# Patient Record
Sex: Female | Born: 1950
Health system: Southern US, Community
[De-identification: ages and names within clinical notes are randomized; demographics above are authoritative.]

## PROBLEM LIST (undated history)

## (undated) DIAGNOSIS — E669 Obesity, unspecified: Secondary | ICD-10-CM

## (undated) DIAGNOSIS — E7800 Pure hypercholesterolemia, unspecified: Secondary | ICD-10-CM

## (undated) DIAGNOSIS — R1900 Intra-abdominal and pelvic swelling, mass and lump, unspecified site: Secondary | ICD-10-CM

## (undated) HISTORY — DX: Intra-abdominal and pelvic swelling, mass and lump, unspecified site: R19.00

## (undated) HISTORY — DX: Obesity, unspecified: E66.9

## (undated) HISTORY — DX: Pure hypercholesterolemia, unspecified: E78.00

## (undated) HISTORY — PX: TUBAL LIGATION: SHX77

## (undated) HISTORY — PX: COLONOSCOPY: SHX174

---

## 2013-04-19 ENCOUNTER — Emergency Department (HOSPITAL_COMMUNITY)
Admission: EM | Admit: 2013-04-19 | Discharge: 2013-04-19 | Disposition: A | Discharge status: Home / Self Care | Payer: BC Managed Care – PPO | Source: Home / Self Care | Attending: Family Medicine | Admitting: Family Medicine

## 2013-04-19 ENCOUNTER — Encounter (HOSPITAL_COMMUNITY): Payer: Self-pay

## 2013-04-19 DIAGNOSIS — M778 Other enthesopathies, not elsewhere classified: Secondary | ICD-10-CM

## 2013-04-19 DIAGNOSIS — IMO0002 Reserved for concepts with insufficient information to code with codable children: Secondary | ICD-10-CM

## 2013-04-19 DIAGNOSIS — M6580 Other synovitis and tenosynovitis, unspecified site: Secondary | ICD-10-CM

## 2013-04-19 MED ORDER — KETOROLAC TROMETHAMINE 30 MG/ML IJ SOLN
30.0000 mg | Freq: Once | INTRAMUSCULAR | Status: AC
  Administered 2013-04-19: 30 mg via INTRAMUSCULAR

## 2013-04-19 MED ORDER — KETOROLAC TROMETHAMINE 30 MG/ML IJ SOLN
INTRAMUSCULAR | Status: AC
  Filled 2013-04-19: qty 1

## 2013-04-19 MED ORDER — DICLOFENAC POTASSIUM 50 MG PO TABS: 50.0000 mg | ORAL_TABLET | Freq: Three times a day (TID) | ORAL | Status: DC

## 2013-04-19 NOTE — ED Notes (Signed)
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 Both arms are painful to touch, R>L,  Right arm swollen, difficult to supinate, pronate her right arm , arrived w ACE wrap on her right arm

## 2013-04-19 NOTE — ED Provider Notes (Signed)
History     CSN: [id number]  Arrival date & time 04/19/13  1347   First MD Initiated Contact with Patient 04/19/13 1426      Chief Complaint  Patient presents with  . Arm Pain    (Consider location/radiation/quality/duration/timing/severity/associated sxs/prior treatment) Patient is a 62 y.o. female presenting with arm pain. The history is provided by the patient.  Arm Pain This is a new problem. 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 The problem has been gradually worsening (bilat sx but r>l).    History reviewed. No pertinent past medical history.  History reviewed. No pertinent past surgical history.  History reviewed. No pertinent family history.  History  Substance Use Topics  . Smoking status: Current Every Day Smoker  . Smokeless tobacco: Not on file  . Alcohol Use: Yes    OB History   Grav Para Term Preterm Abortions TAB SAB Ect Mult Living                  Review of Systems  Musculoskeletal: Positive for myalgias and joint swelling.  Skin: Negative.     Allergies  Review of patient's allergies indicates no known allergies.  Home Medications   Current Outpatient Rx  Name  Route  Sig  Dispense  Refill  . diclofenac (CATAFLAM) 50 MG tablet   Oral   Take 1 tablet (50 mg total) by mouth 3 (three) times daily. Prn arm pain   30 tablet   0     BP 153/84  Pulse 78  Temp(Src) 98.2 F (36.8 C) (Oral)  Resp 18  SpO2 96%  Physical Exam  Nursing note and vitals reviewed. Constitutional: She is oriented to person, place, and time. She appears well-developed and well-nourished.  Musculoskeletal: She exhibits tenderness.  bilat forearm soreness and sts r>l, nv fxn intact.. No warmth or erythema.  Neurological: She is alert and oriented to person, place, and time.  Skin: Skin is warm and dry.    ED Course  Procedures (including critical care time)  Labs Reviewed - No data to display No results  found.   1. Tendonitis of elbow or forearm       MDM          Linna Hoff, MD 04/19/13 1517

## 2014-03-21 ENCOUNTER — Other Ambulatory Visit (HOSPITAL_COMMUNITY)
Admission: RE | Admit: 2014-03-21 | Discharge: 2014-03-21 | Disposition: A | Discharge status: Home / Self Care | Payer: BC Managed Care – PPO | Source: Ambulatory Visit | Attending: Family Medicine | Admitting: Family Medicine

## 2014-03-21 ENCOUNTER — Other Ambulatory Visit: Payer: Self-pay | Admitting: Physician Assistant

## 2014-03-21 DIAGNOSIS — Z124 Encounter for screening for malignant neoplasm of cervix: Secondary | ICD-10-CM | POA: Insufficient documentation

## 2014-07-03 ENCOUNTER — Other Ambulatory Visit: Payer: Self-pay | Admitting: Gastroenterology

## 2016-03-25 ENCOUNTER — Other Ambulatory Visit: Payer: Self-pay | Admitting: Physician Assistant

## 2016-03-25 ENCOUNTER — Other Ambulatory Visit (HOSPITAL_COMMUNITY)
Admission: RE | Admit: 2016-03-25 | Discharge: 2016-03-25 | Disposition: A | Discharge status: Home / Self Care | Payer: BC Managed Care – PPO | Source: Ambulatory Visit | Attending: Family Medicine | Admitting: Family Medicine

## 2016-03-25 DIAGNOSIS — Z124 Encounter for screening for malignant neoplasm of cervix: Secondary | ICD-10-CM | POA: Insufficient documentation

## 2016-03-27 LAB — CYTOLOGY - PAP

## 2017-11-16 DIAGNOSIS — R0989 Other specified symptoms and signs involving the circulatory and respiratory systems: Secondary | ICD-10-CM | POA: Diagnosis not present

## 2017-11-16 DIAGNOSIS — Z23 Encounter for immunization: Secondary | ICD-10-CM | POA: Diagnosis not present

## 2017-11-16 DIAGNOSIS — Z720 Tobacco use: Secondary | ICD-10-CM | POA: Diagnosis not present

## 2017-11-16 DIAGNOSIS — H1130 Conjunctival hemorrhage, unspecified eye: Secondary | ICD-10-CM | POA: Diagnosis not present

## 2017-11-18 DIAGNOSIS — R1313 Dysphagia, pharyngeal phase: Secondary | ICD-10-CM | POA: Diagnosis not present

## 2020-01-24 ENCOUNTER — Ambulatory Visit
Admission: RE | Admit: 2020-01-24 | Discharge: 2020-01-24 | Disposition: A | Discharge status: Home / Self Care | Payer: BC Managed Care – PPO | Source: Ambulatory Visit | Attending: Physician Assistant | Admitting: Physician Assistant

## 2020-01-24 ENCOUNTER — Other Ambulatory Visit (HOSPITAL_COMMUNITY): Payer: Medicare Other

## 2020-01-24 ENCOUNTER — Emergency Department (HOSPITAL_COMMUNITY)
Admission: EM | Admit: 2020-01-24 | Discharge: 2020-01-25 | Disposition: A | Discharge status: Home / Self Care | Payer: Medicare Other | Attending: Emergency Medicine | Admitting: Emergency Medicine

## 2020-01-24 ENCOUNTER — Encounter (HOSPITAL_COMMUNITY): Payer: Self-pay

## 2020-01-24 ENCOUNTER — Other Ambulatory Visit: Payer: Self-pay

## 2020-01-24 ENCOUNTER — Other Ambulatory Visit: Payer: Self-pay | Admitting: Physician Assistant

## 2020-01-24 ENCOUNTER — Emergency Department (HOSPITAL_COMMUNITY): Payer: Medicare Other

## 2020-01-24 DIAGNOSIS — F17210 Nicotine dependence, cigarettes, uncomplicated: Secondary | ICD-10-CM | POA: Insufficient documentation

## 2020-01-24 DIAGNOSIS — R1900 Intra-abdominal and pelvic swelling, mass and lump, unspecified site: Secondary | ICD-10-CM | POA: Diagnosis not present

## 2020-01-24 DIAGNOSIS — R102 Pelvic and perineal pain: Secondary | ICD-10-CM

## 2020-01-24 DIAGNOSIS — Z20822 Contact with and (suspected) exposure to covid-19: Secondary | ICD-10-CM | POA: Insufficient documentation

## 2020-01-24 DIAGNOSIS — N838 Other noninflammatory disorders of ovary, fallopian tube and broad ligament: Secondary | ICD-10-CM | POA: Diagnosis not present

## 2020-01-24 DIAGNOSIS — R319 Hematuria, unspecified: Secondary | ICD-10-CM | POA: Insufficient documentation

## 2020-01-24 DIAGNOSIS — R109 Unspecified abdominal pain: Secondary | ICD-10-CM

## 2020-01-24 LAB — COMPREHENSIVE METABOLIC PANEL
ALT: 12 U/L (ref 0–44)
AST: 14 U/L — ABNORMAL LOW (ref 15–41)
Albumin: 2.9 g/dL — ABNORMAL LOW (ref 3.5–5.0)
Alkaline Phosphatase: 71 U/L (ref 38–126)
Anion gap: 7 (ref 5–15)
BUN: 10 mg/dL (ref 8–23)
CO2: 24 mmol/L (ref 22–32)
Calcium: 9.1 mg/dL (ref 8.9–10.3)
Chloride: 102 mmol/L (ref 98–111)
Creatinine, Ser: 0.96 mg/dL (ref 0.44–1.00)
GFR calc Af Amer: >60 mL/min (ref >60)
GFR calc non Af Amer: >60 mL/min (ref >60)
Glucose, Bld: 123 mg/dL — ABNORMAL HIGH (ref 70–99)
Potassium: 3.8 mmol/L (ref 3.5–5.1)
Sodium: 133 mmol/L — ABNORMAL LOW (ref 135–145)
Total Bilirubin: 0.2 mg/dL — ABNORMAL LOW (ref 0.3–1.2)
Total Protein: 7.5 g/dL (ref 6.5–8.1)

## 2020-01-24 LAB — URINALYSIS, ROUTINE W REFLEX MICROSCOPIC
Bilirubin Urine: NEGATIVE
Glucose, UA: NEGATIVE mg/dL
Hgb urine dipstick: NEGATIVE
Ketones, ur: NEGATIVE mg/dL
Nitrite: NEGATIVE
Protein, ur: NEGATIVE mg/dL
Specific Gravity, Urine: 1.034 — ABNORMAL HIGH (ref 1.005–1.030)
pH: 6 (ref 5.0–8.0)

## 2020-01-24 LAB — CBC WITH DIFFERENTIAL/PLATELET
Abs Immature Granulocytes: 0.18 10*3/uL — ABNORMAL HIGH (ref 0.00–0.07)
Basophils Absolute: 0.1 10*3/uL (ref 0.0–0.1)
Basophils Relative: 1 %
Eosinophils Absolute: 1.3 10*3/uL — ABNORMAL HIGH (ref 0.0–0.5)
Eosinophils Relative: 6 %
HCT: 31.3 % — ABNORMAL LOW (ref 36.0–46.0)
Hemoglobin: 10.1 g/dL — ABNORMAL LOW (ref 12.0–15.0)
Immature Granulocytes: 1 %
Lymphocytes Relative: 19 %
Lymphs Abs: 4.4 10*3/uL — ABNORMAL HIGH (ref 0.7–4.0)
MCH: 23.9 pg — ABNORMAL LOW (ref 26.0–34.0)
MCHC: 32.3 g/dL (ref 30.0–36.0)
MCV: 74 fL — ABNORMAL LOW (ref 80.0–100.0)
Monocytes Absolute: 1.7 10*3/uL — ABNORMAL HIGH (ref 0.1–1.0)
Monocytes Relative: 7 %
Neutro Abs: 15.4 10*3/uL — ABNORMAL HIGH (ref 1.7–7.7)
Neutrophils Relative %: 66 %
Platelets: 644 10*3/uL — ABNORMAL HIGH (ref 150–400)
RBC: 4.23 MIL/uL (ref 3.87–5.11)
RDW: 15.9 % — ABNORMAL HIGH (ref 11.5–15.5)
WBC: 23.1 10*3/uL — ABNORMAL HIGH (ref 4.0–10.5)
nRBC: 0.1 % (ref 0.0–0.2)

## 2020-01-24 LAB — LACTIC ACID, PLASMA: Lactic Acid, Venous: 1.4 mmol/L (ref 0.5–1.9)

## 2020-01-24 LAB — LIPASE, BLOOD: Lipase: 20 U/L (ref 11–51)

## 2020-01-24 MED ORDER — IOPAMIDOL (ISOVUE-300) INJECTION 61%
125.0000 mL | Freq: Once | INTRAVENOUS | Status: AC | PRN
  Administered 2020-01-24: 125 mL via INTRAVENOUS

## 2020-01-24 MED ORDER — MORPHINE SULFATE (PF) 4 MG/ML IV SOLN
4.0000 mg | Freq: Once | INTRAVENOUS | Status: AC
  Administered 2020-01-24: 4 mg via INTRAVENOUS
  Filled 2020-01-24: qty 1

## 2020-01-24 MED ORDER — ONDANSETRON HCL 4 MG/2ML IJ SOLN
4.0000 mg | Freq: Once | INTRAMUSCULAR | Status: AC
  Administered 2020-01-24: 4 mg via INTRAVENOUS
  Filled 2020-01-24: qty 2

## 2020-01-24 NOTE — ED Triage Notes (Signed)
Pt reports ongoing lower abd pain and back pain for about 3-4 days. Pt seen at [facility] clinic with WBC noted to be 21.9. pt states they are working her up to diagnose cancer. Pt having blood in her urine as well. Pt a.o, denies n.v at this time.

## 2020-01-25 ENCOUNTER — Emergency Department (HOSPITAL_COMMUNITY): Payer: Medicare Other

## 2020-01-25 ENCOUNTER — Telehealth: Payer: Self-pay | Admitting: *Deleted

## 2020-01-25 LAB — WET PREP, GENITAL
Clue Cells Wet Prep HPF POC: NONE SEEN
Sperm: NONE SEEN
Trich, Wet Prep: NONE SEEN
Yeast Wet Prep HPF POC: NONE SEEN

## 2020-01-25 LAB — SARS CORONAVIRUS 2 (TAT 6-24 HRS): SARS Coronavirus 2: NEGATIVE

## 2020-01-25 MED ORDER — DOXYCYCLINE HYCLATE 100 MG PO CAPS: 100.0000 mg | ORAL_CAPSULE | Freq: Two times a day (BID) | ORAL | 0 refills | Status: DC

## 2020-01-25 MED ORDER — CEFTRIAXONE SODIUM 500 MG IJ SOLR
500.0000 mg | Freq: Once | INTRAMUSCULAR | Status: AC
  Administered 2020-01-25: 500 mg via INTRAMUSCULAR
  Filled 2020-01-25: qty 500

## 2020-01-25 MED ORDER — OXYCODONE-ACETAMINOPHEN 5-325 MG PO TABS: 1.0000 | ORAL_TABLET | Freq: Four times a day (QID) | ORAL | 0 refills | Status: DC | PRN

## 2020-01-25 MED ORDER — DOXYCYCLINE HYCLATE 100 MG PO TABS
100.0000 mg | ORAL_TABLET | Freq: Once | ORAL | Status: AC
  Administered 2020-01-25: 02:00:00 100 mg via ORAL
  Filled 2020-01-25: qty 1

## 2020-01-25 MED ORDER — METRONIDAZOLE 500 MG PO TABS
500.0000 mg | ORAL_TABLET | Freq: Once | ORAL | Status: AC
  Administered 2020-01-25: 500 mg via ORAL
  Filled 2020-01-25: qty 1

## 2020-01-25 MED ORDER — STERILE WATER FOR INJECTION IJ SOLN
INTRAMUSCULAR | Status: AC
  Filled 2020-01-25: qty 10

## 2020-01-25 MED ORDER — METRONIDAZOLE 500 MG PO TABS: 500.0000 mg | ORAL_TABLET | Freq: Two times a day (BID) | ORAL | 0 refills | Status: DC

## 2020-01-25 NOTE — Discharge Instructions (Signed)
You were seen today and have a mass in your pelvis.  It is not clear exactly what it is but it may be cancer versus an infection.  Take antibiotics as directed.  Take pain medicine as needed.  Do not drive while taking pain medication.  It is very important follow-up with OB/GYN for definitive evaluation.

## 2020-01-25 NOTE — Telephone Encounter (Addendum)
-----   Message from Osborne Oman, MD sent at 01/25/2020  2:01 AM EDT ----- Regarding: Needs appointment very soon Please schedule a visit with any GYN surgeon in the next 1-2 weeks. Seen in ED today 01/25/20 for large pelvic mass concerning for possible cancer.   Unsure of where the pelvic mass is coming from as ultrasound is not clear, so ordered for outpatient MRI of pelvis.  Please schedule this prior to the patient's appointment so that results can be discussed during visit.  Thank you!  3/25  1455  Pt was called and notified of plan of care as follows:     4/3  1:00 pm  MRI @ [place] @ 12:30   4/7  3:35 pm  Office appt w/Dr. Rip Harbour  Pt voiced understanding of all information and instructions given.

## 2020-01-25 NOTE — ED Provider Notes (Signed)
[facility] EMERGENCY DEPARTMENT Provider Note   CSN: [id number] Arrival date & time: 01/24/20  1636     History Chief Complaint  Patient presents with  . Abdominal Pain    Catherine Lynn is a 69 y.o. female.  HPI     This is a 69 year old female with past medical history of hypertension who presents with abdominal pain.  Patient reports onset of abdominal pain on Saturday.  She also reports episodes of hematuria since that time.  She states that the pain is sharp and in her lower abdomen.  It is nonradiating.  It is currently 8 out of 10.  Nothing seems to make it better or worse.  She states that preceding that abdominal pain she had multiple episodes of nonbloody emesis last week.  She was seen and evaluated yesterday by her primary physician and had lab work and CT imaging done.  She was called by them and instructed to come to the ER as she had leukocytosis and concerning findings on her CT of a mass that might be cancer.  She denies any fevers, cough, infectious symptoms.  She does report some back pain.  Patient states that she is not sexually active.  I have reviewed her CT findings.  She has a roughly 7 x 7 x 9 cm mass adjacent to the bladder with some stranding.  Concerning for possible GYN neoplasm versus TOA.  History reviewed. No pertinent past medical history.  There are no problems to display for this patient.   History reviewed. No pertinent surgical history.   OB History   No obstetric history on file.     No family history on file.  Social History   Tobacco Use  . Smoking status: Current Every Day Smoker  Substance Use Topics  . Alcohol use: Yes  . Drug use: Not on file    Home Medications Prior to Admission medications   Medication Sig Start Date End Date Taking? Authorizing Provider  diclofenac (CATAFLAM) 50 MG tablet Take 1 tablet (50 mg total) by mouth 3 (three) times daily. Prn arm pain 04/19/13   Billy Fischer, MD    doxycycline (VIBRAMYCIN) 100 MG capsule Take 1 capsule (100 mg total) by mouth 2 (two) times daily. 01/25/20   Julaine Zimny, Barbette Hair, MD  metroNIDAZOLE (FLAGYL) 500 MG tablet Take 1 tablet (500 mg total) by mouth 2 (two) times daily. 01/25/20   Caley Volkert, Barbette Hair, MD  oxyCODONE-acetaminophen (PERCOCET/ROXICET) 5-325 MG tablet Take 1 tablet by mouth every 6 (six) hours as needed for severe pain. 01/25/20   Eura Radabaugh, Barbette Hair, MD    Allergies    Patient has no known allergies.  Review of Systems   Review of Systems  Constitutional: Negative for fever.  Respiratory: Negative for shortness of breath.   Cardiovascular: Negative for chest pain.  Gastrointestinal: Positive for abdominal pain, nausea and vomiting.  Genitourinary: Positive for hematuria.  Musculoskeletal: Positive for back pain.  All other systems reviewed and are negative.   Physical Exam Updated Vital Signs BP (!) 140/47   Pulse 85   Temp 99.4 F (37.4 C) (Oral)   Resp 16   Ht 1.6 m (5\' 3" )   Wt 94.3 kg   SpO2 97%   BMI 36.81 kg/m   Physical Exam Vitals and nursing note reviewed.  Constitutional:      Appearance: She is well-developed. She is obese. She is not ill-appearing.  HENT:     Head: Normocephalic and atraumatic.  Eyes:     Pupils: Pupils are equal, round, and reactive to light.  Cardiovascular:     Rate and Rhythm: Normal rate and regular rhythm.     Heart sounds: Normal heart sounds.  Pulmonary:     Effort: Pulmonary effort is normal. No respiratory distress.     Breath sounds: No wheezing.  Abdominal:     General: Bowel sounds are normal.     Palpations: Abdomen is soft.     Tenderness: There is abdominal tenderness in the suprapubic area. There is no guarding or rebound.  Genitourinary:    Comments: Slight discharge noted in the vaginal vault, no gross pus, no cervical motion tenderness, mass palpated on bimanual exam without significant tenderness Musculoskeletal:     Cervical back: Neck supple.   Skin:    General: Skin is warm and dry.  Neurological:     Mental Status: She is alert and oriented to person, place, and time.  Psychiatric:        Mood and Affect: Mood normal.     ED Results / Procedures / Treatments   Labs (all labs ordered are listed, but only abnormal results are displayed) Labs Reviewed  WET PREP, GENITAL - Abnormal; Notable for the following components:      Result Value   WBC, Wet Prep HPF POC MANY (*)    All other components within normal limits  COMPREHENSIVE METABOLIC PANEL - Abnormal; Notable for the following components:   Sodium 133 (*)    Glucose, Bld 123 (*)    Albumin 2.9 (*)    AST 14 (*)    Total Bilirubin 0.2 (*)    All other components within normal limits  CBC WITH DIFFERENTIAL/PLATELET - Abnormal; Notable for the following components:   WBC 23.1 (*)    Hemoglobin 10.1 (*)    HCT 31.3 (*)    MCV 74.0 (*)    MCH 23.9 (*)    RDW 15.9 (*)    Platelets 644 (*)    Neutro Abs 15.4 (*)    Lymphs Abs 4.4 (*)    Monocytes Absolute 1.7 (*)    Eosinophils Absolute 1.3 (*)    Abs Immature Granulocytes 0.18 (*)    All other components within normal limits  URINALYSIS, ROUTINE W REFLEX MICROSCOPIC - Abnormal; Notable for the following components:   Specific Gravity, Urine 1.034 (*)    Leukocytes,Ua TRACE (*)    Bacteria, UA RARE (*)    All other components within normal limits  SARS CORONAVIRUS 2 (TAT 6-24 HRS)  LACTIC ACID, PLASMA  LIPASE, BLOOD  LACTIC ACID, PLASMA  CA 125  GC/CHLAMYDIA PROBE AMP ([place]) NOT AT [facility]  WET PREP  (BD AFFIRM) ()    EKG None  Radiology CT ABDOMEN PELVIS W CONTRAST  Addendum Date: 01/24/2020   ADDENDUM REPORT: 01/24/2020 11:11 ADDENDUM: The cystic, irregularly septated mass like area in the right hemipelvis displaces the uterus from left to right and is closely associated with the colon that shows diverticular changes. It does however appear to be centered within the right ovary based  on the position of the ovarian vein though the ovary itself cannot be visualized. Correlate with any recent treatment with antibiotics, even if in the past couple of months as this could lead to an atypical appearance of an inflammatory process. Again gyn consultation and ultrasound is suggested as neoplasm remains a strong differential consideration. These results were called by telephone at the time of interpretation on  01/24/2020 at 11:11 am to provider NOELLE REDMON , who verbally acknowledged these results. Electronically Signed   By: Zetta Bills M.D.   On: 01/24/2020 11:11   Result Date: 01/24/2020 CLINICAL DATA:  Abdominal pain, nausea for 1 week no reported history of cancer. EXAM: CT ABDOMEN AND PELVIS WITH CONTRAST TECHNIQUE: Multidetector CT imaging of the abdomen and pelvis was performed using the standard protocol following bolus administration of intravenous contrast. CONTRAST:  14mL ISOVUE-300 IOPAMIDOL (ISOVUE-300) INJECTION 61% COMPARISON:  None. FINDINGS: Lower chest: No acute findings at the lung bases. Hepatobiliary: Granulomata throughout the liver. No suspicious hepatic lesion with tiny area of low-density measuring approximately 3 mm in the left hepatic lobe (image 3, series 2) portal vein is patent into the liver. The gallbladder is normal without pericholecystic stranding. Mild intra and extrahepatic biliary ductal dilation up to 8 mm with focal area of low attenuation at the level of the ampulla perhaps measuring as much as 15 mm by 9 mm. Pancreas: No pancreatic ductal dilation or peripancreatic inflammation. Spleen: Granulomas throughout the spleen. Adrenals/Urinary Tract: Adrenal glands are normal. Symmetric renal enhancement with mild cortical scarring bilaterally. No hydronephrosis. Urinary bladder is closely related to a right parametrial and adnexal multi septate masslike area. This is atop the right anterior aspect of the urinary bladder. Stomach/Bowel: Rectosigmoid colon in  close proximity to the right adnexal and parametrial process as are small bowel loops in the lower abdomen. The appendix is visualized and is normal. Vascular/Lymphatic: Calcified and noncalcified plaque throughout the abdominal aorta without aneurysm. No adenopathy in the abdomen. Small lymph nodes about the pelvis none with evidence of pathologic enlargement by CT. Reproductive: Masslike area in the right parametrium with central low attenuation and incomplete peripheral septations the upper portion with more complex appearing process in the inferior aspect atop the urinary bladder. This measures 7.4 x 6.9 x 9.0 cm. There is minimal stranding present and a small amount pelvic ascites. Uterus is displaced from right to left. Ovarian vessels can be traced to the margin of this area left adnexa is normal. Other: No signs of hernia. Trace free fluid in the pelvis. Musculoskeletal: No acute bone finding or destructive bone process. IMPRESSION: 1. Masslike area in the right parametrial and adnexal region with central low attenuation and incomplete peripheral septations. This is atop the right anterior aspect of the urinary bladder. This area measures 7.4 x 6.9 x 9.0 cm. There is minimal stranding and a small amount of pelvic ascites. Findings may represent TOA with pyosalpinx though given minimal stranding and complex appearance ovarian neoplasm is also considered. Gyn consultation is suggested. Pelvic ultrasound may be helpful for further characterization. 2. Mild intra and extrahepatic biliary ductal dilation up to 8 mm with focal area of low attenuation at the level of the ampulla perhaps measuring as much as 15 mm by 9 mm. MRCP may be helpful for further, initial characterization. 3. Evidence of prior granulomatous disease in the liver and spleen. Aortic Atherosclerosis (ICD10-I70.0). Electronically Signed: By: Zetta Bills M.D. On: 01/24/2020 10:49   DG Chest Portable 1 View  Result Date: 01/24/2020 CLINICAL  DATA:  Lower abdominal pain, back pain. EXAM: PORTABLE CHEST 1 VIEW COMPARISON:  None. FINDINGS: Heart and mediastinal contours are within normal limits. No focal opacities or effusions. No acute bony abnormality. IMPRESSION: No active disease. Electronically Signed   By: Rolm Baptise M.D.   On: 01/24/2020 23:50   US PELVIC COMPLETE WITH TRANSVAGINAL  Result Date: 01/25/2020 CLINICAL DATA:  Pelvic mass seen on CT 01/24/2020 EXAM: TRANSABDOMINAL AND TRANSVAGINAL ULTRASOUND OF PELVIS TECHNIQUE: Both transabdominal and transvaginal ultrasound examinations of the pelvis were performed. Transabdominal technique was performed for global imaging of the pelvis including uterus, ovaries, adnexal regions, and pelvic cul-de-sac. It was necessary to proceed with endovaginal exam following the transabdominal exam to visualize the . COMPARISON:  CT 01/24/2020 FINDINGS: Uterus Measurements: Either adjacent to or extending from the right side of the uterus there is a complex heterogeneously cystic mass with thick internal septations and nodularity which measures 8.9 x 8.9 x 9.5 cm. No internal vascularity seen within the mass. The uterus measures 6.8 x 2.9 x 2.9 cm. Endometrium Thickness: 3 mm.  No focal abnormality visualized. Right ovary Measurements: Not visualized and cystic complex mass seen extending within the right adnexa. Left ovary Measurements: Not visualized Other findings Trace free fluid within the cul-de-sac. IMPRESSION: Complex cystic mass either extending from the superior right uterus or adjacent to the right uterus within the right adnexa measuring 8.9 x 8.9 x 9.5 cm. This could represent pyosalpinx, TOA, or uterine neoplasm. Nonvisualized ovaries. Electronically Signed   By: Prudencio Pair M.D.   On: 01/25/2020 00:56    Procedures Procedures (including critical care time)  Medications Ordered in ED Medications  sterile water (preservative free) injection (has no administration in time range)  morphine  4 MG/ML injection 4 mg (4 mg Intravenous Given 01/24/20 2352)  ondansetron (ZOFRAN) injection 4 mg (4 mg Intravenous Given 01/24/20 2352)  doxycycline (VIBRA-TABS) tablet 100 mg (100 mg Oral Given 01/25/20 0209)  metroNIDAZOLE (FLAGYL) tablet 500 mg (500 mg Oral Given 01/25/20 0209)  cefTRIAXone (ROCEPHIN) injection 500 mg (500 mg Intramuscular Given 01/25/20 0209)    ED Course  I have reviewed the triage vital signs and the nursing notes.  Pertinent labs & imaging results that were available during my care of the patient were reviewed by me and considered in my medical decision making (see chart for details).  Clinical Course as of Jan 25 215  Thu Jan 25, 2020  0155 Spoke with Dr. Verita Schneiders.  She reviewed the ultrasound.  This is not clearly neoplastic or infectious.  Given white count, would consider treatment.  She does not feel she needs IV treatment if she is not febrile or septic appearing.  Recommends discharge with doxycycline and Flagyl.  Will follow up in clinic.   [CH]  0201 Have added Rocephin IM and a CA-125 per gynecology recommendations.  Patient was updated on plan of care.   [CH]    Clinical Course User Index [CH] Clatie Kessen, Barbette Hair, MD   MDM Rules/Calculators/A&P                       Patient presents with pelvic pain.  Abnormal CT scan and leukocytosis from her primary office.  She is overall nontoxic-appearing.  Vital signs are reassuring.  She is afebrile here.  She does not appear septic and has a normal lactate.  Patient was given pain and nausea medication.  Pelvic ultrasound was obtained and shows a 9 x 9 x 9 cm TOA versus pyosalpinx versus neoplasm.  Pelvic exam is not concerning for gross pus and patient denies sexual activity.  However, given her white count, feel it is reasonable to treat for infection.  See discussion with OB/GYN above.  Plan for outpatient antibiotics and close follow-up with pelvic MRI.  Patient was advised of plan.  She will also be sent  with a short course of pain medication.  After history, exam, and medical workup I feel the patient has been appropriately medically screened and is safe for discharge home. Pertinent diagnoses were discussed with the patient. Patient was given return precautions.   Final Clinical Impression(s) / ED Diagnoses Final diagnoses:  Pelvic mass  Ovarian mass    Rx / DC Orders ED Discharge Orders         Ordered    MR PELVIS W WO CONTRAST     01/25/20 0206    doxycycline (VIBRAMYCIN) 100 MG capsule  2 times daily     01/25/20 0216    metroNIDAZOLE (FLAGYL) 500 MG tablet  2 times daily     01/25/20 0216    oxyCODONE-acetaminophen (PERCOCET/ROXICET) 5-325 MG tablet  Every 6 hours PRN     01/25/20 0216           Merryl Hacker, MD 01/25/20 [phone]

## 2020-01-26 LAB — GC/CHLAMYDIA PROBE AMP (~~LOC~~) NOT AT ARMC
Chlamydia: NEGATIVE
Neisseria Gonorrhea: NEGATIVE

## 2020-01-26 LAB — CA 125: Cancer Antigen (CA) 125: 13.1 U/mL (ref 0.0–38.1)

## 2020-02-03 ENCOUNTER — Ambulatory Visit (HOSPITAL_COMMUNITY): Admission: RE | Admit: 2020-02-03 | Payer: Medicare Other | Source: Ambulatory Visit

## 2020-02-07 ENCOUNTER — Encounter: Payer: Medicare Other | Admitting: Obstetrics & Gynecology

## 2020-02-12 ENCOUNTER — Ambulatory Visit (HOSPITAL_COMMUNITY)
Admission: RE | Admit: 2020-02-12 | Discharge: 2020-02-12 | Disposition: A | Discharge status: Home / Self Care | Payer: Medicare Other | Source: Ambulatory Visit | Attending: Obstetrics & Gynecology | Admitting: Obstetrics & Gynecology

## 2020-02-12 ENCOUNTER — Other Ambulatory Visit: Payer: Self-pay

## 2020-02-12 DIAGNOSIS — R102 Pelvic and perineal pain: Secondary | ICD-10-CM | POA: Insufficient documentation

## 2020-02-12 MED ORDER — GADOBUTROL 1 MMOL/ML IV SOLN
9.0000 mL | Freq: Once | INTRAVENOUS | Status: AC | PRN
  Administered 2020-02-12: 9 mL via INTRAVENOUS

## 2020-02-15 ENCOUNTER — Ambulatory Visit (INDEPENDENT_AMBULATORY_CARE_PROVIDER_SITE_OTHER): Payer: Medicare Other | Admitting: Obstetrics & Gynecology

## 2020-02-15 ENCOUNTER — Other Ambulatory Visit: Payer: Self-pay

## 2020-02-15 ENCOUNTER — Encounter: Payer: Self-pay | Admitting: Obstetrics & Gynecology

## 2020-02-15 VITALS — BP 139/79 | HR 90 | Ht 63.0 in | Wt 206.3 lb

## 2020-02-15 DIAGNOSIS — N907 Vulvar cyst: Secondary | ICD-10-CM | POA: Diagnosis not present

## 2020-02-15 DIAGNOSIS — R1900 Intra-abdominal and pelvic swelling, mass and lump, unspecified site: Secondary | ICD-10-CM

## 2020-02-15 NOTE — Progress Notes (Signed)
Patient ID: Catherine Lynn, female   DOB: 09-15-1951, 69 y.o.   MRN: [id number]  Chief Complaint  Patient presents with  . Gynecologic Exam  pelvic mass seen on imaging  HPI Catherine Lynn is a 69 y.o. female. G2P2 No LMP recorded. She was having some low abdominal pain and was seen by her PCP and was sent to the [facility] 3/24 for evaluation. CT and US showed a 7 x 9 cm right adnexal mass suspicious for neoplasm. Because the mass could have been infectious she was given antibiotic and this is complete. CA 125 was normal HPI  History reviewed. No pertinent past medical history.  Past Surgical History:  Procedure Laterality Date  . NO PAST SURGERIES      History reviewed. No pertinent family history.  Social History Social History   Tobacco Use  . Smoking status: Current Every Day Smoker  Substance Use Topics  . Alcohol use: Yes  . Drug use: Not on file    No Known Allergies  Current Outpatient Medications  Medication Sig Dispense Refill  . atorvastatin (LIPITOR) 20 MG tablet Take 20 mg by mouth daily.    Marland Kitchen oxyCODONE-acetaminophen (PERCOCET/ROXICET) 5-325 MG tablet Take 1 tablet by mouth every 6 (six) hours as needed for severe pain. 15 tablet 0  . diclofenac (CATAFLAM) 50 MG tablet Take 1 tablet (50 mg total) by mouth 3 (three) times daily. Prn arm pain (Patient not taking: Reported on 02/15/2020) 30 tablet 0   No current facility-administered medications for this visit.    Review of Systems Review of Systems  Constitutional: Negative.   Gastrointestinal: Positive for abdominal pain. Negative for abdominal distention.  Genitourinary: Positive for hematuria (resolved) and pelvic pain. Negative for vaginal bleeding and vaginal discharge.    Blood pressure 139/79, pulse 90, height 5\' 3"  (1.6 m), weight 206 lb 4.8 oz (93.6 kg).  Physical Exam Physical Exam Constitutional:      Appearance: Normal appearance. She is not ill-appearing.  Pulmonary:     Effort: Pulmonary effort  is normal.  Abdominal:     General: There is no distension.     Palpations: There is no mass.  Genitourinary:    Comments: Vulvar inclusion cysts on the right, not tender, cervix no lesions, uterus normal, fullness on the right possible mass, no tenderness Neurological:     Mental Status: She is alert.     Data Reviewed CLINICAL DATA:  Pelvic pain, mass  EXAM: MRI PELVIS WITHOUT AND WITH CONTRAST  TECHNIQUE: Multiplanar multisequence MR imaging of the pelvis was performed both before and after administration of intravenous contrast.  CONTRAST:  65mL GADAVIST GADOBUTROL 1 MMOL/ML IV SOLN  COMPARISON:  CT abdomen pelvis, 01/24/2020, pelvic ultrasound, 01/25/2020  FINDINGS: Urinary Tract:  No abnormality visualized.  Bowel:  Severe sigmoid diverticulosis.  Vascular/Lymphatic: No pathologically enlarged lymph nodes. No significant vascular abnormality seen.  Reproductive: No significant change in a multi loculated appearing, thick rinded, contrast enhancing complex mass or fluid collection which appears to arise from the right ovary or adnexa, measuring 8.2 x 7.1 x 6.3 cm. This is again noted to lie in very close apposition to the severely diverticular sigmoid colon, with loss of distinguishing fat plane. The normal right ovary cannot be visualized. The uterus is shifted leftward, and as with the sigmoid colon, cannot be clearly distinguished from the adjacent mass.  Other:  None.  Musculoskeletal: No suspicious bone lesions identified.  IMPRESSION: 1. No significant change in a multi loculated appearing, thick rinded,  contrast enhancing complex mass or fluid collection which appears to arise from the right ovary or adnexa, measuring 8.2 x 7.1 x 6.3 cm. This remains most concerning for primary ovarian malignancy although infectious process such as tubo-ovarian abscess or an unusually large, chronic diverticular abscess are difficult to exclude based on  appearance and relationship to adjacent structures.  2. No secondary findings of malignancy such as lymphadenopathy in the pelvis.   Electronically Signed   By: Eddie Candle M.D.   On: 02/12/2020 18:13 Results for [name], [name] (MRN [id number]) as of 02/15/2020 11:04  Ref. Range 01/25/2020 02:01  Cancer Antigen (CA) 125 Latest Ref Range: 0.0 - 38.1 U/mL 13.1   Assessment Right adnexal mass with findings most concerning for primary ovarian malignancy although infectious process such as tubo-ovarian abscess   Plan She will be referred to [facility] oncology for consultation and management PCP [facility]    Emeterio Reeve 02/15/2020, 11:01 AM

## 2020-02-15 NOTE — Patient Instructions (Signed)
Pelvic Mass, Female  A pelvic mass is an abnormal growth in the pelvis. The pelvis is the area between your hip bones. It includes the bladder, rectum, uterus, and ovaries. A pelvic mass may be found during a routine pelvic exam or while performing an MRI, CT scan, or ultrasound for other problems of the abdomen. What are common types of pelvic masses? Pelvic masses include:  Ovarian cysts. These are fluid-filled sacs that form on an ovary.  Tumors. These may be cancerous (malignant) or noncancerous (benign). Noncancerous tumors in the uterus are called uterine fibroids.  Ectopic pregnancy. This is when the fertilized egg attaches (implants) outside the uterus.  Infections. What type of testing may be needed? Your health care provider may recommend that you have tests to diagnose the cause of the pelvic mass. The following tests may be done if a pelvic mass is found:  Physical exam.  Blood tests.  X-rays.  Ultrasound.  CT scan.  MRI.  A surgery to look inside your abdomen with cameras (laparoscopy).  A biopsy that is performed with a needle or during laparoscopy or surgery. In some cases, what seemed like a pelvic mass may actually be something else, such as a mass in one of the organs that is near the pelvis, an infection (abscess), or scar tissue (adhesions) that formed after a surgery. Tests and physical exams may be done once, or they may be done regularly for a period of time. Tests and exams that are done regularly will help monitor whether the mass or tissue change is growing and becoming a concern. What are common treatments? Treatment is not always needed for this condition. Your health care provider may recommend careful monitoring (watchful waiting) and regular tests and exams. Treatment will depend on the cause of the mass. Follow these instructions at home:  What you need to do at home will depend on the cause of the mass. Follow the instructions that your health  care provider gives to you. In general: ? Keep all follow-up visits as directed by your health care provider. This is important. ? Take over-the-counter and prescription medicines only as directed by your health care provider. ? If you were prescribed an antibiotic medicine, take it as told by your health care provider. Do not stop taking the antibiotic even if you start to feel better. ? Follow any restrictions that are given to you by your health care provider.  Try to stay calm, and be sure to ask questions. Make sure you understand the recommendations for monitoring and whether there is a reason for concern. Contact a health care provider if you:  Develop new symptoms.  Note changes in the size, shape, or position of your mass.  Are unable to have a bowel movement.  Bruise or bleed easily. Get help right away if you:  Vomit bright red blood or vomit material that looks like coffee grounds.  Have blood in your stools, or the stools turn black and tarry.  Have an abnormal or increased amount of vaginal bleeding.  Have a fever or chills.  Develop sudden or worsening pain that is not relieved by medicine.  Feel dizzy or weak.  Feel light-headed or you faint.  Feel that the mass has suddenly gotten larger.  Develop severe bloating in your abdomen or your pelvis.  Cannot pass any urine. Summary  A pelvic mass is an abnormal growth in the pelvis. The pelvis is the area between your hip bones. It includes the bladder, rectum,  uterus, and ovaries.  Pelvic masses include ovarian cysts, tumors, ectopic pregnancy, or infections.  Your health care provider may recommend that you have tests to diagnose the cause of the pelvic mass.  Treatment will depend on the cause of the mass. This information is not intended to replace advice given to you by your health care provider. Make sure you discuss any questions you have with your health care provider. Document Revised: 11/10/2017  Document Reviewed: 11/10/2017 Elsevier Patient Education  2020 [employer]  

## 2020-02-19 NOTE — Addendum Note (Signed)
Addended by: Bethanne Ginger on: 02/19/2020 04:29 PM   Modules accepted: Orders

## 2020-02-21 ENCOUNTER — Telehealth: Payer: Self-pay | Admitting: *Deleted

## 2020-02-21 NOTE — Telephone Encounter (Signed)
Called and spoke with the patient regarding her new appt patient. Scheduled an appt for 4/27 at 10:30 am. Gave the address and phone for the clinic. Gave the policy for parking, visitors and mask

## 2020-02-26 NOTE — H&P (View-Only) (Signed)
GYNECOLOGIC ONCOLOGY NEW PATIENT CONSULTATION   Patient Name: Catherine Lynn  Patient Age: 69 y.o. Date of Service: 02/27/20 Referring Provider: Lennie Odor, [place] [employer] [address] [place],  [place] [address]   Primary Care Provider: Lennie Odor, [place] Consulting Provider: Jeral Pinch, MD   Assessment/Plan:  Adnexal mass in a postmenopausal female which may be ovarian in origin versus related to significant diverticular disease.  Discussed imaging findings with the patient which show an adnexal mass slightly less than 10 cm with some features concerning for possible malignancy.  There is no evidence of metastatic disease on multiple imaging modalities.  I am somewhat concerned regarding possibility of bowel involvement given loss of fat plane between this mass and the sigmoid on her recent MRI.  Although her normal Ca1 25 is overall reassuring, we discussed the nonspecific and nonsensitive nature of Ca1 25.  Additionally, this tumor marker can be normal in 30-50% of early-stage ovarian cancers.  I recommended proceeding with surgical exploration for both diagnostic and therapeutic purposes.  The patient wished that her sister be on the phone to hear this discussion and I reviewed this information again with her sister listening.    The patient also wanted to know if it was possible to biopsy or drain the mass.  We discussed the possibility of peritoneal seeding and spread if this is in fact a cancer.  Given the fact that she is a good surgical candidate, I would recommend proceeding with surgery.  Ultimately, in consultation with her sister, she was in agreement.  I discussed the surgical plan of performing a robotic BSO.  I would plan to remove the adnexal mass first and send this to pathology for intraoperative frozen section.  If no malignancy identified, then no further surgery would be performed.  If malignancy is identified, then I would recommend staging to include total  hysterectomy, omentectomy, lymph node dissection, peritoneal biopsies, and tumor debulking. Possible bowel surgery.  Given risk of bowel involvement requiring possible bowel resection, I will have the patient perform a bowel prep prior to surgery.  This information was reviewed with her today.  The risks of surgery were discussed in detail and she understands these to include infection; wound separation; hernia; vaginal cuff separation, injury to adjacent organs such as bowel, bladder, blood vessels, ureters and nerves; bleeding which may require blood transfusion; anesthesia risk; thromboembolic events; possible death; unforeseen complications; possible need for re-exploration; medical complications such as heart attack, stroke, pleural effusion and pneumonia; and, if full lymphadenectomy is performed the risk of lymphedema and lymphocyst. The patient will receive DVT and antibiotic prophylaxis as indicated. She voiced a clear understanding. She had the opportunity to ask questions. Perioperative instructions were reviewed with her. Prescriptions for post-op medications were sent to her pharmacy of choice.  A copy of this note was sent to the patient's referring provider.   50 minutes of total time was spent for this patient encounter, including preparation, face-to-face counseling with the patient and coordination of care, and documentation of the encounter.  Jeral Pinch, MD  Division of Gynecologic Oncology  Department of Obstetrics and Gynecology  [facility]  ___________________________________________  Chief Complaint: Chief Complaint  Patient presents with  . Adnexal mass    New pateint    History of Present Illness:  Catherine Lynn is a 69 y.o. y.o. female who is seen in consultation at the request of [place], [place], [place] for an evaluation of an adnexal mass.  She  was having some low abdominal pain and was seen by her PCP in mid-March. She had lab work  and CT imaging performed and was instructed to present to the ER given findings of a mass on CT and elevated WBC. She was see in the [facility] ED on 3/24. CT and US showed a 7 x 9 cm right adnexal mass suspicious for neoplasm. Because the mass could have been infectious she was treated with antibiotics. CA 125 was normal.  She subsequently underwent an MRI of the pelvis on 4/12 showing a right adnexal mass measuring 8.2 x 7.1 x 6.3 cm in close proximity to the severely diverticular sigmoid colon with loss of distinguishing fat plane.  The patient notes continued intermittent back and bilateral side pain for which she takes Tylenol with some relief.  She notes decreased appetite over the last couple of weeks as well as about a 20 pound weight loss.  She also endorses constipation and early satiety during this time.  After starting her antibiotics, she noticed some minimal vaginal spotting when she wiped.  She denies any other vaginal bleeding or cramping.  She notes some urinary frequency the last several weeks.  Endorses occasional alcohol intake, remote tobacco use history, and THC use currently approximately once a month.  PAST MEDICAL HISTORY:  Past Medical History:  Diagnosis Date  . High cholesterol   . Obesity (BMI 30-39.9)   . Pelvic mass      PAST SURGICAL HISTORY:  Past Surgical History:  Procedure Laterality Date  . TUBAL LIGATION      OB/GYN HISTORY:  OB History  Gravida Para Term Preterm AB Living  2 2       2   SAB TAB Ectopic Multiple Live Births          2    # Outcome Date GA Lbr Len/2nd Weight Sex Delivery Anes PTL Lv  2 Para           1 Para             No LMP recorded.  Age at menarche: 36-17  Age at menopause: early 36s Hx of HRT: no Hx of STDs: no Last pap: 2017 History of abnormal pap smears: no  MEDICATIONS: Outpatient Encounter Medications as of 02/27/2020  Medication Sig  . atorvastatin (LIPITOR) 20 MG tablet Take 20 mg by mouth daily.  Derrill Memo ON  03/11/2020] erythromycin base (E-MYCIN) 500 MG tablet The day before surgery, take 2 tablets at 2pm, 3pm, and 10 pm  . ibandronate (BONIVA) 150 MG tablet Take 150 mg by mouth every 30 (thirty) days.  Marland Kitchen ibuprofen (ADVIL) 600 MG tablet Take 1 tablet (600 mg total) by mouth every 6 (six) hours as needed for moderate pain. For AFTER surgery  . [START ON 03/11/2020] neomycin (MYCIFRADIN) 500 MG tablet The day before surgery, take 2 tablets at 2pm, 3pm, and 10 pm  . senna-docusate (SENOKOT-S) 8.6-50 MG tablet Take 2 tablets by mouth at bedtime. For AFTER surgery, do not take if having diarrhea  . traMADol (ULTRAM) 50 MG tablet Take 1 tablet (50 mg total) by mouth every 6 (six) hours as needed for severe pain. For AFTER surgery, do not take and drive  . [DISCONTINUED] diclofenac (CATAFLAM) 50 MG tablet Take 1 tablet (50 mg total) by mouth 3 (three) times daily. Prn arm pain (Patient not taking: Reported on 02/15/2020)  . [DISCONTINUED] oxyCODONE-acetaminophen (PERCOCET/ROXICET) 5-325 MG tablet Take 1 tablet by mouth every 6 (six) hours as  needed for severe pain. (Patient not taking: Reported on 02/27/2020)   No facility-administered encounter medications on file as of 02/27/2020.    ALLERGIES:  No Known Allergies   FAMILY HISTORY:  Family History  Problem Relation Age of Onset  . Breast cancer Sister   . Diabetes Sister   . Cancer Maternal Grandfather        not sure  . Cancer Niece        not sure     SOCIAL HISTORY:    Social Connections:   . Frequency of Communication with Friends and Family:   . Frequency of Social Gatherings with Friends and Family:   . Attends Religious Services:   . Active Member of Clubs or Organizations:   . Attends Archivist Meetings:   Marland Kitchen Marital Status:     REVIEW OF SYSTEMS:  Pertinent positives as per HPI. Denies fevers, chills, fatigue. Denies hearing loss, neck lumps or masses, mouth sores, ringing in ears or voice changes. Denies cough or  wheezing.  Denies shortness of breath. Denies chest pain or palpitations. Denies leg swelling. Denies abdominal distention, blood in stools, diarrhea, nausea, vomiting. Denies pain with intercourse, dysuria, hematuria or incontinence. Denies hot flashes, pelvic pain or vaginal discharge.   Denies joint pain, back pain or muscle pain/cramps. Denies itching, rash, or wounds. Denies dizziness, headaches, numbness or seizures. Denies swollen lymph nodes or glands, denies easy bruising or bleeding. Denies anxiety, depression, confusion, or decreased concentration.  Physical Exam:  Vital Signs for this encounter:  Blood pressure 131/67, pulse 95, temperature 98.2 F (36.8 C), temperature source Temporal, resp. rate 18, height 5\' 3"  (1.6 m), weight 201 lb (91.2 kg), SpO2 100 %. Body mass index is 35.61 kg/m. General: Alert, oriented, no acute distress.  HEENT: Normocephalic, atraumatic. Sclera anicteric.  Chest: Clear to auscultation bilaterally. No wheezes, rhonchi, or rales. Cardiovascular: Regular rate and rhythm, no murmurs, rubs, or gallops.  Abdomen: Obese. Normoactive bowel sounds. Soft, nondistended, nontender to palpation. No masses or hepatosplenomegaly appreciated. No palpable fluid wave.  Extremities: Grossly normal range of motion. Warm, well perfused. No edema bilaterally.  Skin: No rashes or lesions.  Lymphatics: No cervical, supraclavicular, or inguinal adenopathy.  GU:  Several right vulvar inclusion cysts measuring up to 2.5cm. Otherwise  normal external female genitalia.  No discharge or bleeding.             Bladder/urethra:  No lesions or masses, well supported bladder             Vagina: Mildly atrophic, no lesions or masses.             Cervix: Normal appearing, no lesions.             Uterus/adnexa: Uterus minimally mobile and moves in conjunction with  solid, smooth and firm mass appreciated in the cul-de-sac and on the  right.   Rectal: Confirms the above findings, no  nodularity, difficult to distinguish  bowel involvement.  LABORATORY AND RADIOLOGIC DATA:  Outside medical records were reviewed to synthesize the above history, along with the history and physical obtained during the visit.   Lab Results  Component Value Date   WBC 23.1 (H) 01/24/2020   HGB 10.1 (L) 01/24/2020   HCT 31.3 (L) 01/24/2020   PLT 644 (H) 01/24/2020   GLUCOSE 123 (H) 01/24/2020   ALT 12 01/24/2020   AST 14 (L) 01/24/2020   NA 133 (L) 01/24/2020   K 3.8 01/24/2020   CL  102 01/24/2020   CREATININE 0.96 01/24/2020   BUN 10 01/24/2020   CO2 24 01/24/2020   CA-125: 13.1   MRI pelvis on 4/12: 1. No significant change in a multi loculated appearing, thick rinded, contrast enhancing complex mass or fluid collection which appears to arise from the right ovary or adnexa, measuring 8.2 x 7.1 x 6.3 cm. This remains most concerning for primary ovarian malignancy although infectious process such as tubo-ovarian abscess or an unusually large, chronic diverticular abscess are difficult to exclude based on appearance and relationship to adjacent structures. 2. No secondary findings of malignancy such as lymphadenopathy in the pelvis.  Pelvic ultrasound on 3/25: Complex cystic mass either extending from the superior right uterus or adjacent to the right uterus within the right adnexa measuring 8.9 x 8.9 x 9.5 cm. This could represent pyosalpinx, TOA, or uterine neoplasm.  CT A/P 3/24: 1. Masslike area in the right parametrial and adnexal region with central low attenuation and incomplete peripheral septations. This is atop the right anterior aspect of the urinary bladder. This area measures 7.4 x 6.9 x 9.0 cm. There is minimal stranding and a small amount of pelvic ascites. Findings may represent TOA with pyosalpinx though given minimal stranding and complex appearance ovarian neoplasm is also considered. Gyn consultation is suggested. Pelvic ultrasound may be helpful for further  characterization. 2. Mild intra and extrahepatic biliary ductal dilation up to 8 mm with focal area of low attenuation at the level of the ampulla perhaps measuring as much as 15 mm by 9 mm. MRCP may be helpful for further, initial characterization. 3. Evidence of prior granulomatous disease in the liver and spleen. Addendum: The cystic, irregularly septated mass like area in the right hemipelvis displaces the uterus from left to right and is closely associated with the colon that shows diverticular changes. It does however appear to be centered within the right ovary based on the position of the ovarian vein though the ovary itself cannot be visualized. Correlate with any recent treatment with antibiotics, even if in the past couple of months as this could lead to an atypical appearance of an inflammatory process. Again gyn consultation and ultrasound is suggested as neoplasm remains a strong differential consideration.

## 2020-02-26 NOTE — Progress Notes (Signed)
GYNECOLOGIC ONCOLOGY NEW PATIENT CONSULTATION   Patient Name: Catherine Lynn  Patient Age: 69 y.o. Date of Service: 02/27/20 Referring Provider: Lennie Odor, [place] [employer] [address] [place],  [place] [address]   Primary Care Provider: Lennie Odor, [place] Consulting Provider: Jeral Pinch, MD   Assessment/Plan:  Adnexal mass in a postmenopausal female which may be ovarian in origin versus related to significant diverticular disease.  Discussed imaging findings with the patient which show an adnexal mass slightly less than 10 cm with some features concerning for possible malignancy.  There is no evidence of metastatic disease on multiple imaging modalities.  I am somewhat concerned regarding possibility of bowel involvement given loss of fat plane between this mass and the sigmoid on her recent MRI.  Although her normal Ca1 25 is overall reassuring, we discussed the nonspecific and nonsensitive nature of Ca1 25.  Additionally, this tumor marker can be normal in 30-50% of early-stage ovarian cancers.  I recommended proceeding with surgical exploration for both diagnostic and therapeutic purposes.  The patient wished that her sister be on the phone to hear this discussion and I reviewed this information again with her sister listening.    The patient also wanted to know if it was possible to biopsy or drain the mass.  We discussed the possibility of peritoneal seeding and spread if this is in fact a cancer.  Given the fact that she is a good surgical candidate, I would recommend proceeding with surgery.  Ultimately, in consultation with her sister, she was in agreement.  I discussed the surgical plan of performing a robotic BSO.  I would plan to remove the adnexal mass first and send this to pathology for intraoperative frozen section.  If no malignancy identified, then no further surgery would be performed.  If malignancy is identified, then I would recommend staging to include total  hysterectomy, omentectomy, lymph node dissection, peritoneal biopsies, and tumor debulking. Possible bowel surgery.  Given risk of bowel involvement requiring possible bowel resection, I will have the patient perform a bowel prep prior to surgery.  This information was reviewed with her today.  The risks of surgery were discussed in detail and she understands these to include infection; wound separation; hernia; vaginal cuff separation, injury to adjacent organs such as bowel, bladder, blood vessels, ureters and nerves; bleeding which may require blood transfusion; anesthesia risk; thromboembolic events; possible death; unforeseen complications; possible need for re-exploration; medical complications such as heart attack, stroke, pleural effusion and pneumonia; and, if full lymphadenectomy is performed the risk of lymphedema and lymphocyst. The patient will receive DVT and antibiotic prophylaxis as indicated. She voiced a clear understanding. She had the opportunity to ask questions. Perioperative instructions were reviewed with her. Prescriptions for post-op medications were sent to her pharmacy of choice.  A copy of this note was sent to the patient's referring provider.   50 minutes of total time was spent for this patient encounter, including preparation, face-to-face counseling with the patient and coordination of care, and documentation of the encounter.  Jeral Pinch, MD  Division of Gynecologic Oncology  Department of Obstetrics and Gynecology  [facility]  ___________________________________________  Chief Complaint: Chief Complaint  Patient presents with  . Adnexal mass    New pateint    History of Present Illness:  Catherine Lynn is a 69 y.o. y.o. female who is seen in consultation at the request of [place], [place], [place] for an evaluation of an adnexal mass.  She  was having some low abdominal pain and was seen by her PCP in mid-March. She had lab work  and CT imaging performed and was instructed to present to the ER given findings of a mass on CT and elevated WBC. She was see in the [facility] ED on 3/24. CT and US showed a 7 x 9 cm right adnexal mass suspicious for neoplasm. Because the mass could have been infectious she was treated with antibiotics. CA 125 was normal.  She subsequently underwent an MRI of the pelvis on 4/12 showing a right adnexal mass measuring 8.2 x 7.1 x 6.3 cm in close proximity to the severely diverticular sigmoid colon with loss of distinguishing fat plane.  The patient notes continued intermittent back and bilateral side pain for which she takes Tylenol with some relief.  She notes decreased appetite over the last couple of weeks as well as about a 20 pound weight loss.  She also endorses constipation and early satiety during this time.  After starting her antibiotics, she noticed some minimal vaginal spotting when she wiped.  She denies any other vaginal bleeding or cramping.  She notes some urinary frequency the last several weeks.  Endorses occasional alcohol intake, remote tobacco use history, and THC use currently approximately once a month.  PAST MEDICAL HISTORY:  Past Medical History:  Diagnosis Date  . High cholesterol   . Obesity (BMI 30-39.9)   . Pelvic mass      PAST SURGICAL HISTORY:  Past Surgical History:  Procedure Laterality Date  . TUBAL LIGATION      OB/GYN HISTORY:  OB History  Gravida Para Term Preterm AB Living  2 2       2   SAB TAB Ectopic Multiple Live Births          2    # Outcome Date GA Lbr Len/2nd Weight Sex Delivery Anes PTL Lv  2 Para           1 Para             No LMP recorded.  Age at menarche: 18-17  Age at menopause: early 24s Hx of HRT: no Hx of STDs: no Last pap: 2017 History of abnormal pap smears: no  MEDICATIONS: Outpatient Encounter Medications as of 02/27/2020  Medication Sig  . atorvastatin (LIPITOR) 20 MG tablet Take 20 mg by mouth daily.  Derrill Memo ON  03/11/2020] erythromycin base (E-MYCIN) 500 MG tablet The day before surgery, take 2 tablets at 2pm, 3pm, and 10 pm  . ibandronate (BONIVA) 150 MG tablet Take 150 mg by mouth every 30 (thirty) days.  Marland Kitchen ibuprofen (ADVIL) 600 MG tablet Take 1 tablet (600 mg total) by mouth every 6 (six) hours as needed for moderate pain. For AFTER surgery  . [START ON 03/11/2020] neomycin (MYCIFRADIN) 500 MG tablet The day before surgery, take 2 tablets at 2pm, 3pm, and 10 pm  . senna-docusate (SENOKOT-S) 8.6-50 MG tablet Take 2 tablets by mouth at bedtime. For AFTER surgery, do not take if having diarrhea  . traMADol (ULTRAM) 50 MG tablet Take 1 tablet (50 mg total) by mouth every 6 (six) hours as needed for severe pain. For AFTER surgery, do not take and drive  . [DISCONTINUED] diclofenac (CATAFLAM) 50 MG tablet Take 1 tablet (50 mg total) by mouth 3 (three) times daily. Prn arm pain (Patient not taking: Reported on 02/15/2020)  . [DISCONTINUED] oxyCODONE-acetaminophen (PERCOCET/ROXICET) 5-325 MG tablet Take 1 tablet by mouth every 6 (six) hours as  needed for severe pain. (Patient not taking: Reported on 02/27/2020)   No facility-administered encounter medications on file as of 02/27/2020.    ALLERGIES:  No Known Allergies   FAMILY HISTORY:  Family History  Problem Relation Age of Onset  . Breast cancer Sister   . Diabetes Sister   . Cancer Maternal Grandfather        not sure  . Cancer Niece        not sure     SOCIAL HISTORY:    Social Connections:   . Frequency of Communication with Friends and Family:   . Frequency of Social Gatherings with Friends and Family:   . Attends Religious Services:   . Active Member of Clubs or Organizations:   . Attends Archivist Meetings:   Marland Kitchen Marital Status:     REVIEW OF SYSTEMS:  Pertinent positives as per HPI. Denies fevers, chills, fatigue. Denies hearing loss, neck lumps or masses, mouth sores, ringing in ears or voice changes. Denies cough or  wheezing.  Denies shortness of breath. Denies chest pain or palpitations. Denies leg swelling. Denies abdominal distention, blood in stools, diarrhea, nausea, vomiting. Denies pain with intercourse, dysuria, hematuria or incontinence. Denies hot flashes, pelvic pain or vaginal discharge.   Denies joint pain, back pain or muscle pain/cramps. Denies itching, rash, or wounds. Denies dizziness, headaches, numbness or seizures. Denies swollen lymph nodes or glands, denies easy bruising or bleeding. Denies anxiety, depression, confusion, or decreased concentration.  Physical Exam:  Vital Signs for this encounter:  Blood pressure 131/67, pulse 95, temperature 98.2 F (36.8 C), temperature source Temporal, resp. rate 18, height 5\' 3"  (1.6 m), weight 201 lb (91.2 kg), SpO2 100 %. Body mass index is 35.61 kg/m. General: Alert, oriented, no acute distress.  HEENT: Normocephalic, atraumatic. Sclera anicteric.  Chest: Clear to auscultation bilaterally. No wheezes, rhonchi, or rales. Cardiovascular: Regular rate and rhythm, no murmurs, rubs, or gallops.  Abdomen: Obese. Normoactive bowel sounds. Soft, nondistended, nontender to palpation. No masses or hepatosplenomegaly appreciated. No palpable fluid wave.  Extremities: Grossly normal range of motion. Warm, well perfused. No edema bilaterally.  Skin: No rashes or lesions.  Lymphatics: No cervical, supraclavicular, or inguinal adenopathy.  GU:  Several right vulvar inclusion cysts measuring up to 2.5cm. Otherwise  normal external female genitalia.  No discharge or bleeding.             Bladder/urethra:  No lesions or masses, well supported bladder             Vagina: Mildly atrophic, no lesions or masses.             Cervix: Normal appearing, no lesions.             Uterus/adnexa: Uterus minimally mobile and moves in conjunction with  solid, smooth and firm mass appreciated in the cul-de-sac and on the  right.   Rectal: Confirms the above findings, no  nodularity, difficult to distinguish  bowel involvement.  LABORATORY AND RADIOLOGIC DATA:  Outside medical records were reviewed to synthesize the above history, along with the history and physical obtained during the visit.   Lab Results  Component Value Date   WBC 23.1 (H) 01/24/2020   HGB 10.1 (L) 01/24/2020   HCT 31.3 (L) 01/24/2020   PLT 644 (H) 01/24/2020   GLUCOSE 123 (H) 01/24/2020   ALT 12 01/24/2020   AST 14 (L) 01/24/2020   NA 133 (L) 01/24/2020   K 3.8 01/24/2020   CL  102 01/24/2020   CREATININE 0.96 01/24/2020   BUN 10 01/24/2020   CO2 24 01/24/2020   CA-125: 13.1   MRI pelvis on 4/12: 1. No significant change in a multi loculated appearing, thick rinded, contrast enhancing complex mass or fluid collection which appears to arise from the right ovary or adnexa, measuring 8.2 x 7.1 x 6.3 cm. This remains most concerning for primary ovarian malignancy although infectious process such as tubo-ovarian abscess or an unusually large, chronic diverticular abscess are difficult to exclude based on appearance and relationship to adjacent structures. 2. No secondary findings of malignancy such as lymphadenopathy in the pelvis.  Pelvic ultrasound on 3/25: Complex cystic mass either extending from the superior right uterus or adjacent to the right uterus within the right adnexa measuring 8.9 x 8.9 x 9.5 cm. This could represent pyosalpinx, TOA, or uterine neoplasm.  CT A/P 3/24: 1. Masslike area in the right parametrial and adnexal region with central low attenuation and incomplete peripheral septations. This is atop the right anterior aspect of the urinary bladder. This area measures 7.4 x 6.9 x 9.0 cm. There is minimal stranding and a small amount of pelvic ascites. Findings may represent TOA with pyosalpinx though given minimal stranding and complex appearance ovarian neoplasm is also considered. Gyn consultation is suggested. Pelvic ultrasound may be helpful for further  characterization. 2. Mild intra and extrahepatic biliary ductal dilation up to 8 mm with focal area of low attenuation at the level of the ampulla perhaps measuring as much as 15 mm by 9 mm. MRCP may be helpful for further, initial characterization. 3. Evidence of prior granulomatous disease in the liver and spleen. Addendum: The cystic, irregularly septated mass like area in the right hemipelvis displaces the uterus from left to right and is closely associated with the colon that shows diverticular changes. It does however appear to be centered within the right ovary based on the position of the ovarian vein though the ovary itself cannot be visualized. Correlate with any recent treatment with antibiotics, even if in the past couple of months as this could lead to an atypical appearance of an inflammatory process. Again gyn consultation and ultrasound is suggested as neoplasm remains a strong differential consideration.

## 2020-02-27 ENCOUNTER — Encounter: Payer: Self-pay | Admitting: Gynecologic Oncology

## 2020-02-27 ENCOUNTER — Inpatient Hospital Stay: Payer: Medicare Other | Attending: Gynecologic Oncology | Admitting: Gynecologic Oncology

## 2020-02-27 ENCOUNTER — Other Ambulatory Visit: Payer: Self-pay

## 2020-02-27 VITALS — BP 131/67 | HR 95 | Temp 98.2°F | Resp 18 | Ht 63.0 in | Wt 201.0 lb

## 2020-02-27 DIAGNOSIS — R1900 Intra-abdominal and pelvic swelling, mass and lump, unspecified site: Secondary | ICD-10-CM | POA: Insufficient documentation

## 2020-02-27 DIAGNOSIS — K5900 Constipation, unspecified: Secondary | ICD-10-CM | POA: Insufficient documentation

## 2020-02-27 DIAGNOSIS — N9489 Other specified conditions associated with female genital organs and menstrual cycle: Secondary | ICD-10-CM

## 2020-02-27 DIAGNOSIS — D398 Neoplasm of uncertain behavior of other specified female genital organs: Secondary | ICD-10-CM

## 2020-02-27 DIAGNOSIS — R6881 Early satiety: Secondary | ICD-10-CM | POA: Diagnosis not present

## 2020-02-27 DIAGNOSIS — Z780 Asymptomatic menopausal state: Secondary | ICD-10-CM | POA: Diagnosis not present

## 2020-02-27 DIAGNOSIS — R350 Frequency of micturition: Secondary | ICD-10-CM | POA: Insufficient documentation

## 2020-02-27 DIAGNOSIS — Z79899 Other long term (current) drug therapy: Secondary | ICD-10-CM | POA: Diagnosis not present

## 2020-02-27 MED ORDER — NEOMYCIN SULFATE 500 MG PO TABS: ORAL_TABLET | ORAL | 0 refills | Status: DC

## 2020-02-27 MED ORDER — ERYTHROMYCIN BASE 500 MG PO TABS: ORAL_TABLET | ORAL | 0 refills | Status: DC

## 2020-02-27 MED ORDER — SENNOSIDES-DOCUSATE SODIUM 8.6-50 MG PO TABS: 2.0000 | ORAL_TABLET | Freq: Every day | ORAL | 0 refills | Status: DC

## 2020-02-27 MED ORDER — IBUPROFEN 600 MG PO TABS: 600.0000 mg | ORAL_TABLET | Freq: Four times a day (QID) | ORAL | 0 refills | Status: DC | PRN

## 2020-02-27 MED ORDER — TRAMADOL HCL 50 MG PO TABS: 50.0000 mg | ORAL_TABLET | Freq: Four times a day (QID) | ORAL | 0 refills | Status: DC | PRN

## 2020-02-27 NOTE — Patient Instructions (Addendum)
Preparing for your Surgery  Plan for surgery on Mar 12, 2020 with Dr. Jeral Pinch at [place] will be scheduled for a robotic assisted laparoscopic bilateral salpingo-oophorectomy, possible total laparoscopic hysterectomy (removal of cervix and uterus) and staging if cancer identified, possible bowel resection.   Pre-operative Testing -You will receive a phone call from presurgical testing at [facility] to arrange for a pre-operative appointment over the phone, lab appointment, and COVID test. The COVID test normally happens 3 days prior to the surgery and they ask that you self quarantine after the test up until surgery to decrease chance of exposure.  -Bring your insurance card, copy of an advanced directive if applicable, medication list  -At that visit, you will be asked to sign a consent for a possible blood transfusion in case a transfusion becomes necessary during surgery.  The need for a blood transfusion is rare but having consent is a necessary part of your care.     -You should not be taking blood thinners or aspirin at least ten days prior to surgery unless instructed by your surgeon.  -Do not take supplements such as fish oil (omega 3), red yeast rice, turmeric before your surgery.   Day Before Surgery at [place] with ANTIBIOTICS THE DAY BEFORE SURGERY. SEE ATTACHED HANDOUT.  Your role in recovery Your role is to become active as soon as directed by your doctor, while still giving yourself time to heal.  Rest when you feel tired. You will be asked to do the following in order to speed your recovery:  - Cough and breathe deeply. This helps to clear and expand your lungs and can prevent pneumonia after surgery.  - [place]. Do mild physical activity. Walking or moving your legs help your circulation and body functions return to normal. Do not try to get up or walk alone the first time after  surgery.   -If you develop swelling on one leg or the other, pain in the back of your leg, redness/warmth in one of your legs, please call the office or go to the Emergency Room to have a doppler to rule out a blood clot. For shortness of breath, chest pain-seek care in the Emergency Room as soon as possible. - Actively manage your pain. Managing your pain lets you move in comfort. We will ask you to rate your pain on a scale of zero to 10. It is your responsibility to tell your doctor or nurse where and how much you hurt so your pain can be treated.  Special Considerations -If you are diabetic, you may be placed on insulin after surgery to have closer control over your blood sugars to promote healing and recovery.  This does not mean that you will be discharged on insulin.  If applicable, your oral antidiabetics will be resumed when you are tolerating a solid diet.  -Your final pathology results from surgery should be available around one week after surgery and the results will be relayed to you when available.  -FMLA forms can be faxed to [phone] and please allow 5-7 business days for completion.  -Dr. Lahoma Crocker is the name of the surgeon that assists Dr. Berline Lopes in the surgery.  Pain Management After Surgery -You have been prescribed your pain medication and bowel regimen medications before surgery so that you can have these available when you are discharged from the hospital. The pain medication is  for use ONLY AFTER surgery and a new prescription will not be given.   -Make sure that you have Tylenol and Ibuprofen at home to use on a regular basis after surgery for pain control. We recommend alternating the medications every hour to six hours since they work differently and are processed in the body differently for pain relief.  -Review the attached handout on narcotic use and their risks and side effects.   Bowel Regimen -You have been prescribed Sennakot-S to take nightly to  prevent constipation especially if you are taking the narcotic pain medication intermittently.  It is important to prevent constipation and drink adequate amounts of liquids. You can stop taking this medication when you are not taking pain medication and you are back on your normal bowel routine.  Risks of Surgery Risks of surgery are low but include bleeding, infection, damage to surrounding structures, re-operation, blood clots, and very rarely death.   Blood Transfusion Information (For the consent to be signed before surgery)  We will be checking your blood type before surgery so in case of emergencies, we will know what type of blood you would need.                                            WHAT IS A BLOOD TRANSFUSION?  A transfusion is the replacement of blood or some of its parts. Blood is made up of multiple cells which provide different functions.  Red blood cells carry oxygen and are used for blood loss replacement.  White blood cells fight against infection.  Platelets control bleeding.  Plasma helps clot blood.  Other blood products are available for specialized needs, such as hemophilia or other clotting disorders. BEFORE THE TRANSFUSION  Who gives blood for transfusions?   You may be able to donate blood to be used at a later date on yourself (autologous donation).  Relatives can be asked to donate blood. This is generally not any safer than if you have received blood from a stranger. The same precautions are taken to ensure safety when a relative's blood is donated.  Healthy volunteers who are fully evaluated to make sure their blood is safe. This is blood bank blood. Transfusion therapy is the safest it has ever been in the practice of medicine. Before blood is taken from a donor, a complete history is taken to make sure that person has no history of diseases nor engages in risky social behavior (examples are intravenous drug use or sexual activity with multiple  partners). The donor's travel history is screened to minimize risk of transmitting infections, such as malaria. The donated blood is tested for signs of infectious diseases, such as HIV and hepatitis. The blood is then tested to be sure it is compatible with you in order to minimize the chance of a transfusion reaction. If you or a relative donates blood, this is often done in anticipation of surgery and is not appropriate for emergency situations. It takes many days to process the donated blood. RISKS AND COMPLICATIONS Although transfusion therapy is very safe and saves many lives, the main dangers of transfusion include:   Getting an infectious disease.  Developing a transfusion reaction. This is an allergic reaction to something in the blood you were given. Every precaution is taken to prevent this. The decision to have a blood transfusion has been considered carefully by your  caregiver before blood is given. Blood is not given unless the benefits outweigh the risks.  AFTER SURGERY INSTRUCTIONS  Return to work: 4-6 weeks if applicable  Activity: 1. Be up and out of the bed during the day.  Take a nap if needed.  You may walk up steps but be careful and use the hand rail.  Stair climbing will tire you more than you think, you may need to stop part way and rest.   2. No lifting or straining for 6 weeks over 10 pounds. No pushing, pulling, straining for 6 weeks.  3. No driving for 1 week(s).  Do not drive if you are taking narcotic pain medicine and make sure that your reaction time has returned.   4. You can shower as soon as the next day after surgery. Shower daily.  Use soap and water on your incision and pat dry; don't rub.  No tub baths or submerging your body in water until cleared by your surgeon. If you have the soap that was given to you by pre-surgical testing that was used before surgery, you do not need to use it afterwards because this can irritate your incisions.   5. No sexual  activity and nothing in the vagina for 2 weeks. IF YOU HAVE A HYSTERECTOMY, NOTHING IN THE VAGINA FOR 8 WEEKS.  6. You may experience a small amount of clear drainage from your incisions, which is normal.  If the drainage persists, increases, or changes color please call the office.  7. Do not use creams, lotions, or ointments such as neosporin on your incisions after surgery until advised by your surgeon because they can cause removal of the dermabond glue on your incisions.    8. You may experience vaginal spotting after surgery or around the 6-8 week mark from surgery when the stitches at the top of the vagina begin to dissolve (IF YOU HAVE A HYSTERECTOMY).  The spotting is normal but if you experience heavy bleeding, call our office.  9. Take Tylenol or ibuprofen first for pain and only use narcotic pain medication for severe pain not relieved by the Tylenol or Ibuprofen.  Monitor your Tylenol intake to a max of 4,000 mg.  Diet: 1. Low sodium Heart Healthy Diet is recommended.  2. It is safe to use a laxative, such as Miralax or Colace, if you have difficulty moving your bowels. You can take Sennakot at bedtime every evening to keep bowel movements regular and to prevent constipation.    Wound Care: 1. Keep clean and dry.  Shower daily.  Reasons to call the Doctor:  Fever - Oral temperature greater than 100.4 degrees Fahrenheit  Foul-smelling vaginal discharge  Difficulty urinating  Nausea and vomiting  Increased pain at the site of the incision that is unrelieved with pain medicine.  Difficulty breathing with or without chest pain  New calf pain especially if only on one side  Sudden, continuing increased vaginal bleeding with or without clots.   Contacts: For questions or concerns you should contact:  Dr. Jeral Pinch at [phone]  Joylene John, NP at [phone]  After Hours: call [phone] and have the GYN Oncologist paged/contacted  [facility] supplies for the bowel prep at a pharmacy of your choice: Office-e-mailed prescriptions for your antibiotic pills (Neomycin & Erythromycin)  2 bottles of magnesium citrate- no prescription required    Change your diet to make the bowel prep go more  easily: Switch to a bland, low fiber diet Stop eating any nuts, popcorn, or fruit with seeds.  Stop all fiber supplements such as Metamucil, Miralax, etc.    Improve nutrition: Consider drinking 2-3 nutritional shakes (Ex: Ensure Surgery) every day, starting 5 days prior to surgery   DAY PRIOR TO SURGERY   Switch to a full liquid diet the day before surgery Drink plenty of liquids all day to avoid getting dehydrated    12:00pm Drink two bottles of magnesium citrate.   (You should finish in 2 hours)  2:00pm Take 2 Neomycin 500mg  tablets & 2 Erythromycin 500mg  tablets  3:00pm Take 2 Neomycin 500mg  tablets & 2 Erythromycin 500mg  tablets  Drink plenty of clear liquids all evening to avoid getting dehydrated  10:00pm Take 2 Neomycin 500mg  tablets & 2 Erythromycin 500mg  tablets  Drink 2 Carbohydrate loading nutrition drinks (ex: Ensure Presurgery). These will be given at pre-op appointment. Do not eat anything solid after bedtime (midnight) the night before your surgery.   BUT DO drink plenty of clear liquids (Water, Gatorade, juice, soda, coffee, tea, broths, etc.) up to 3 hours prior to surgery to avoid getting dehydrated.    MORNING OF SURGERY  Remember to not to eat anything solid that morning  Drink one final carbohydrate loading nutritional drink (ex: Ensure Presurgery) upon waking up in the morning (needs to be 3 hours before your surgery). Hold or take medications as recommended by the hospital staff at your Preoperative visit Stop drinking liquids before you leave the house (>3 hours prior to surgery)  If you have questions or concerns, please call GYN Oncology Office at  [phone] during business hours to speak to the clinical staff for advice.    WHAT DO I NEED TO KNOW ABOUT A FULL LIQUID DIET? -You may have any liquid. -You may have any food that becomes a liquid at room temperature. The food is considered a liquid if it can be poured off a spoon at room temperature. WHAT FOODS CAN I EAT? -Grains: Any grain food that can be pureed in soup (such as crackers, pasta, and rice). Hot cereal (such as farina or oatmeal) that has been blended.  -Fruits: Fruit juice, including nectars and juices. -Meats and Other Protein Sources: Eggs in custard, eggnog mix, and eggs used in ice cream or pudding. Strained meats, like in baby food, may be allowed. Consult your health care provider.  -Dairy: Milk and milk-based beverages, including milk shakes and instant breakfast mixes. Smooth yogurt. Pureed cottage cheese. Avoid these foods if they are not well tolerated. -Beverages: All beverages, including liquid nutritional supplements. AVOID CARBONATED BEVERAGES. -Condiments: Iodized salt, pepper, spices, and flavorings. Cocoa powder. Vinegar, ketchup, yellow mustard, smooth sauces (such as hollandaise, cheese sauce, or white sauce), and soy sauce. -Sweets and Desserts: Custard, smooth pudding. Flavored gelatin. Tapioca, junket. Plain ice cream, sherbet, fruit ices. Frozen ice pops, frozen fudge pops, pudding pops, and other frozen bars with cream. Syrups, including chocolate syrup. Sugar, honey, jelly.  -Fats and Oils: Margarine, butter, cream, sour cream, and oils. -Other: Broth and cream soups. Strained, broth-based soups. The items listed above may not be a complete list of recommended foods or beverages.  WHAT FOODS CAN I NOT EAT? Grains: All breads. Grains are not allowed unless they are pureed into soup. Vegetables: No raw vegetables. Vegetables are not allowed unless they are juiced, or cooked and pureed into soup. Fruits: Fruits are not allowed unless they are  juiced. Meats and Other  Protein Sources: Any meat or fish. Cooked or raw eggs. Nut butters.  Dairy: Cheese.  Condiments: Stone ground mustards. Fats and Oils: Fats that are coarse or chunky. Sweets and Desserts: Ice cream or other frozen desserts that have any solids in them or on top, such as nuts, chocolate chips, and pieces of cookies. Cakes. Cookies. Candy. Others: Soups with chunks or pieces in them.

## 2020-02-29 ENCOUNTER — Other Ambulatory Visit: Payer: Self-pay | Admitting: Gynecologic Oncology

## 2020-02-29 ENCOUNTER — Telehealth: Payer: Self-pay | Admitting: *Deleted

## 2020-02-29 DIAGNOSIS — N9489 Other specified conditions associated with female genital organs and menstrual cycle: Secondary | ICD-10-CM

## 2020-02-29 NOTE — Telephone Encounter (Addendum)
Patient called and stated "I wanted to let to Dr Berline Lopes  know my niece (my sister's daughter) is in the hospital now with cancer. I don't know what kind. Also I have right pain, it's not bad. I can use tylenol for it."

## 2020-02-29 NOTE — Patient Instructions (Addendum)
DUE TO COVID-19 ONLY ONE VISITOR ARE ALLOWED TO COME WITH YOU AND STAY IN THE WAITING ROOM ONLY DURING PRE OP AND PROCEDURE. THEN TWO VISITORS MAY VISIT WITH YOU IN YOUR PRIVATE ROOM DURING VISITING HOURS ONLY!!   COVID SWAB TESTING MUST BE COMPLETED ON: Friday, Mar 08, 2020 at  11:15AM  [address], [place] [facility] testing line (Must self quarantine after testing. Follow instructions on handout.)             Your procedure is scheduled on: Tuesday, Mar 12, 2020   Report to [facility] Main  Entrance   Report to Short Stay at 5:30 AM   [facility])   Call this number if you have problems the morning of surgery [phone]  [facility] supplies for the bowel prep at a pharmacy of your choice:  Office-e-mailed prescriptions for your antibiotic pills (Neomycin & Erythromycin)  2 bottles of magnesium citrate- no prescription required    Change your diet to make the bowel prep go more easily:  Switch to a bland, low fiber diet  Stop eating any nuts, popcorn, or fruit with seeds.  Stop all fiber supplements such as Metamucil, Miralax, etc.    Improve nutrition:  Consider drinking 2-3 nutritional shakes (Ex: Ensure Surgery) every day, starting 5 days prior to surgery    DAY PRIOR TO SURGERY   Switch to a full liquid diet the day before surgery  Drink plenty of liquids all day to avoid getting dehydrated    12:00pm  Drink two bottles of magnesium citrate.  (You should finish in 2 hours)   2:00pm  Take 2 Neomycin 500mg  tablets & 2 Erythromycin 500mg  tablets   3:00pm  Take 2 Neomycin 500mg  tablets & 2 Erythromycin 500mg  tablets  Drink plenty of clear liquids all evening to avoid getting dehydrated   10:00pm  Take 2 Neomycin 500mg  tablets & 2 Erythromycin 500mg  tablets  Drink 2 Carbohydrate loading nutrition drinks (ex: Ensure Presurgery). These will be given at  pre-op appointment.    Do not eat anything solid after bedtime (midnight) the night before your surgery.   BUT DO drink plenty of clear liquids (Water, Gatorade, juice, soda, coffee, tea, broths, etc.) up to 3 hours prior to surgery to avoid getting dehydrated.    MORNING OF SURGERY   Remember to not to eat anything solid that morning  Drink one final carbohydrate loading nutritional drink (ex: Ensure Presurgery) upon waking up in the morning (needs to be 3 hours before your surgery). 4:30AM Hold or take medications as recommended by the hospital staff at your Preoperative visit  Stop drinking liquids before you leave the house (>3 hours prior to surgery)     If you have questions or concerns, please call GYN Oncology Office at [phone] during business hours to speak to the clinical staff for advice.   CLEAR LIQUID DIET  Foods Allowed                                                                     Foods Excluded  Water, Black Coffee and tea, regular and decaf  liquids that you cannot  Plain Jell-O in any flavor  (No red)                                           see through such as: Fruit ices (not with fruit pulp)                                     milk, soups, orange juice  Iced Popsicles (No red)                                    All solid food                                   Apple juices Sports drinks like Gatorade (No red) Lightly seasoned clear broth or consume(fat free) Sugar, honey syrup  Sample Menu Breakfast                                Lunch                                     Supper Cranberry juice                    Beef broth                            Chicken broth Jell-O                                     Grape juice                           Apple juice Coffee or tea                        Jell-O                                      Popsicle                                                Coffee or tea                         Coffee or tea    WHAT DO I NEED TO KNOW ABOUT A FULL LIQUID DIET?  -You may have any liquid.  -You may have any food that becomes a liquid at room temperature. The food is considered a liquid if it can be poured off a spoon at room temperature.  WHAT FOODS CAN I EAT?  -Grains: Any grain food that can be pureed in  soup (such as crackers, pasta, and rice). Hot cereal (such as farina or oatmeal) that has been blended.  -Fruits: Fruit juice, including nectars and juices.  -Meats and Other Protein Sources: Eggs in custard, eggnog mix, and eggs used in ice cream or pudding. Strained meats, like in baby food, may be allowed. Consult your health care provider.  -Dairy: Milk and milk-based beverages, including milk shakes and instant breakfast mixes. Smooth yogurt. Pureed cottage cheese. Avoid these foods if they are not well tolerated.  -Beverages: All beverages, including liquid nutritional supplements. AVOID CARBONATED BEVERAGES.  -Condiments: Iodized salt, pepper, spices, and flavorings. Cocoa powder. Vinegar, ketchup, yellow mustard, smooth sauces (such as hollandaise, cheese sauce, or white sauce), and soy sauce.  -Sweets and Desserts: Custard, smooth pudding. Flavored gelatin. Tapioca, junket. Plain ice cream, sherbet, fruit ices. Frozen ice pops, frozen fudge pops, pudding pops, and other frozen bars with cream. Syrups, including chocolate syrup. Sugar, honey, jelly.  -Fats and Oils: Margarine, butter, cream, sour cream, and oils.  -Other: Broth and cream soups. Strained, broth-based soups.  The items listed above may not be a complete list of recommended foods or beverages.  WHAT FOODS CAN I NOT EAT?  Grains: All breads. Grains are not allowed unless they are pureed into soup.  Vegetables: No raw vegetables. Vegetables are not allowed unless they are juiced, or cooked and pureed into soup.  Fruits: Fruits are not allowed unless they are juiced.  Meats and Other Protein Sources: Any meat or  fish. Cooked or raw eggs. Nut butters.  Dairy: Cheese.  Condiments: Stone ground mustards.  Fats and Oils: Fats that are coarse or chunky.  Sweets and Desserts: Ice cream or other frozen desserts that have any solids in them or on top, such as nuts, chocolate chips, and pieces of cookies. Cakes. Cookies. Candy.  Others: Soups with chunks or pieces in them.   Oral Hygiene is also important to reduce your risk of infection.                                    Remember - BRUSH YOUR TEETH THE MORNING OF SURGERY WITH YOUR REGULAR TOOTHPASTE   Do NOT smoke after Midnight   Take these medicines the morning of surgery with A SIP OF WATER: Atorvastatin                               You may not have any metal on your body including hair pins, jewelry, and body piercings             Do not wear make-up, lotions, powders, perfumes/cologne, or deodorant             Do not wear nail polish.  Do not shave  48 hours prior to surgery.                Do not bring valuables to the hospital. [place].   Contacts, dentures or bridgework may not be worn into surgery.   Bring small overnight bag day of surgery.    Patients discharged the day of surgery will not be allowed to drive home.   Special Instructions: Bring a copy of your healthcare power of attorney and living will documents  the day of surgery if you haven't scanned them in before.              Please read over the following fact sheets you were given: IF YOU HAVE QUESTIONS ABOUT YOUR PRE OP INSTRUCTIONS PLEASE CALL [phone]   [place] - Preparing for Surgery Before surgery, you can play an important role.  Because skin is not sterile, your skin needs to be as free of germs as possible.  You can reduce the number of germs on your skin by washing with CHG (chlorahexidine gluconate) soap before surgery.  CHG is an antiseptic cleaner which kills germs and bonds with the skin to continue  killing germs even after washing. Please DO NOT use if you have an allergy to CHG or antibacterial soaps.  If your skin becomes reddened/irritated stop using the CHG and inform your nurse when you arrive at Short Stay. Do not shave (including legs and underarms) for at least 48 hours prior to the first CHG shower.  You may shave your face/neck.  Please follow these instructions carefully:  1.  Shower with CHG Soap the night before surgery and the  morning of surgery.  2.  If you choose to wash your hair, wash your hair first as usual with your normal  shampoo.  3.  After you shampoo, rinse your hair and body thoroughly to remove the shampoo.                             4.  Use CHG as you would any other liquid soap.  You can apply chg directly to the skin and wash.  Gently with a scrungie or clean washcloth.  5.  Apply the CHG Soap to your body ONLY FROM THE NECK DOWN.   Do   not use on face/ open                           Wound or open sores. Avoid contact with eyes, ears mouth and   genitals (private parts).                       Wash face,  Genitals (private parts) with your normal soap.             6.  Wash thoroughly, paying special attention to the area where your    surgery  will be performed.  7.  Thoroughly rinse your body with warm water from the neck down.  8.  DO NOT shower/wash with your normal soap after using and rinsing off the CHG Soap.                9.  Pat yourself dry with a clean towel.            10.  Wear clean pajamas.            11.  Place clean sheets on your bed the night of your first shower and do not  sleep with pets. Day of Surgery : Do not apply any lotions/deodorants the morning of surgery.  Please wear clean clothes to the hospital/surgery center.  FAILURE TO FOLLOW THESE INSTRUCTIONS MAY RESULT IN THE CANCELLATION OF YOUR SURGERY  PATIENT SIGNATURE_________________________________  NURSE  SIGNATURE__________________________________  ________________________________________________________________________  WHAT IS A BLOOD TRANSFUSION? Blood Transfusion Information  A transfusion is the replacement of blood or some of its parts. Blood  is made up of multiple cells which provide different functions.  Red blood cells carry oxygen and are used for blood loss replacement.  White blood cells fight against infection.  Platelets control bleeding.  Plasma helps clot blood.  Other blood products are available for specialized needs, such as hemophilia or other clotting disorders. BEFORE THE TRANSFUSION  Who gives blood for transfusions?   Healthy volunteers who are fully evaluated to make sure their blood is safe. This is blood bank blood. Transfusion therapy is the safest it has ever been in the practice of medicine. Before blood is taken from a donor, a complete history is taken to make sure that person has no history of diseases nor engages in risky social behavior (examples are intravenous drug use or sexual activity with multiple partners). The donor's travel history is screened to minimize risk of transmitting infections, such as malaria. The donated blood is tested for signs of infectious diseases, such as HIV and hepatitis. The blood is then tested to be sure it is compatible with you in order to minimize the chance of a transfusion reaction. If you or a relative donates blood, this is often done in anticipation of surgery and is not appropriate for emergency situations. It takes many days to process the donated blood. RISKS AND COMPLICATIONS Although transfusion therapy is very safe and saves many lives, the main dangers of transfusion include:   Getting an infectious disease.  Developing a transfusion reaction. This is an allergic reaction to something in the blood you were given. Every precaution is taken to prevent this. The decision to have a blood transfusion has been  considered carefully by your caregiver before blood is given. Blood is not given unless the benefits outweigh the risks. AFTER THE TRANSFUSION  Right after receiving a blood transfusion, you will usually feel much better and more energetic. This is especially true if your red blood cells have gotten low (anemic). The transfusion raises the level of the red blood cells which carry oxygen, and this usually causes an energy increase.  The nurse administering the transfusion will monitor you carefully for complications. HOME CARE INSTRUCTIONS  No special instructions are needed after a transfusion. You may find your energy is better. Speak with your caregiver about any limitations on activity for underlying diseases you may have. SEEK MEDICAL CARE IF:   Your condition is not improving after your transfusion.  You develop redness or irritation at the intravenous (IV) site. SEEK IMMEDIATE MEDICAL CARE IF:  Any of the following symptoms occur over the next 12 hours:  Shaking chills.  You have a temperature by mouth above 102 F (38.9 C), not controlled by medicine.  Chest, back, or muscle pain.  People around you feel you are not acting correctly or are confused.  Shortness of breath or difficulty breathing.  Dizziness and fainting.  You get a rash or develop hives.  You have a decrease in urine output.  Your urine turns a dark color or changes to pink, red, or brown. Any of the following symptoms occur over the next 10 days:  You have a temperature by mouth above 102 F (38.9 C), not controlled by medicine.  Shortness of breath.  Weakness after normal activity.  The white part of the eye turns yellow (jaundice).  You have a decrease in the amount of urine or are urinating less often.  Your urine turns a dark color or changes to pink, red, or brown. Document Released: 10/16/2000 Document Revised: 01/11/2012  Document Reviewed: 06/04/2008 [facility] Patient Information 2014  [place], [place].  _______________________________________________________________________

## 2020-03-04 ENCOUNTER — Telehealth: Payer: Self-pay | Admitting: Gynecologic Oncology

## 2020-03-04 NOTE — Telephone Encounter (Signed)
Received most recent note from [facility] on 3/23.  Patient was seen at that time with abdominal pain and hematuria.  She also endorsed some constipation for which she had taken a laxative.  Urine culture was performed and patient was counseled to take MiraLAX daily to help with her constipation.  Labs at that visit were remarkable for a white blood cell count of 18.5, hemoglobin of 9.7, hematocrit 29.2, platelets 623,000.  Given leukocytosis and anemia, CT abdomen and pelvis was recommended.  Jeral Pinch MD Gynecologic Oncology

## 2020-03-05 ENCOUNTER — Encounter (HOSPITAL_COMMUNITY)
Admission: RE | Admit: 2020-03-05 | Discharge: 2020-03-05 | Disposition: A | Discharge status: Home / Self Care | Payer: Medicare Other | Source: Ambulatory Visit | Attending: Gynecologic Oncology | Admitting: Gynecologic Oncology

## 2020-03-05 ENCOUNTER — Encounter (HOSPITAL_COMMUNITY): Payer: Self-pay

## 2020-03-05 ENCOUNTER — Other Ambulatory Visit: Payer: Self-pay

## 2020-03-05 DIAGNOSIS — Z01812 Encounter for preprocedural laboratory examination: Secondary | ICD-10-CM | POA: Insufficient documentation

## 2020-03-05 LAB — COMPREHENSIVE METABOLIC PANEL
ALT: 10 U/L (ref 0–44)
AST: 11 U/L — ABNORMAL LOW (ref 15–41)
Albumin: 3.7 g/dL (ref 3.5–5.0)
Alkaline Phosphatase: 69 U/L (ref 38–126)
Anion gap: 9 (ref 5–15)
BUN: 9 mg/dL (ref 8–23)
CO2: 24 mmol/L (ref 22–32)
Calcium: 9.4 mg/dL (ref 8.9–10.3)
Chloride: 104 mmol/L (ref 98–111)
Creatinine, Ser: 0.96 mg/dL (ref 0.44–1.00)
GFR calc Af Amer: >60 mL/min (ref >60)
GFR calc non Af Amer: >60 mL/min (ref >60)
Glucose, Bld: 123 mg/dL — ABNORMAL HIGH (ref 70–99)
Potassium: 3.8 mmol/L (ref 3.5–5.1)
Sodium: 137 mmol/L (ref 135–145)
Total Bilirubin: 0.4 mg/dL (ref 0.3–1.2)
Total Protein: 8.1 g/dL (ref 6.5–8.1)

## 2020-03-05 LAB — CBC
HCT: 35.8 % — ABNORMAL LOW (ref 36.0–46.0)
Hemoglobin: 11.3 g/dL — ABNORMAL LOW (ref 12.0–15.0)
MCH: 23.3 pg — ABNORMAL LOW (ref 26.0–34.0)
MCHC: 31.6 g/dL (ref 30.0–36.0)
MCV: 73.8 fL — ABNORMAL LOW (ref 80.0–100.0)
Platelets: 561 10*3/uL — ABNORMAL HIGH (ref 150–400)
RBC: 4.85 MIL/uL (ref 3.87–5.11)
RDW: 20 % — ABNORMAL HIGH (ref 11.5–15.5)
WBC: 18.4 10*3/uL — ABNORMAL HIGH (ref 4.0–10.5)
nRBC: 0 % (ref 0.0–0.2)

## 2020-03-05 LAB — URINALYSIS, ROUTINE W REFLEX MICROSCOPIC
Bilirubin Urine: NEGATIVE
Glucose, UA: NEGATIVE mg/dL
Hgb urine dipstick: NEGATIVE
Ketones, ur: NEGATIVE mg/dL
Nitrite: NEGATIVE
Protein, ur: 30 mg/dL — AB
Specific Gravity, Urine: 1.019 (ref 1.005–1.030)
pH: 5 (ref 5.0–8.0)

## 2020-03-05 NOTE — Progress Notes (Signed)
PCP - N. Redmon P.A. Cardiologist - N/A  Chest x-ray - 01/24/20 in epic EKG -  N/A Stress Test - N/A ECHO - N/A Cardiac Cath - N/A  Sleep Study - N/A CPAP - N/A  Fasting Blood Sugar - N/A Checks Blood Sugar __N/A___ times a day  Blood Thinner Instructions: N/A Aspirin Instructions: N/A Last Dose: N/A  Anesthesia review: N/A  Patient denies shortness of breath, fever, cough and chest pain at PAT appointment   Patient verbalized understanding of instructions that were given to them at the PAT appointment. Patient was also instructed that they will need to review over the PAT instructions again at home before surgery.

## 2020-03-06 LAB — ABO/RH: ABO/RH(D): O POS

## 2020-03-08 ENCOUNTER — Other Ambulatory Visit (HOSPITAL_COMMUNITY)
Admission: RE | Admit: 2020-03-08 | Discharge: 2020-03-08 | Disposition: A | Discharge status: Home / Self Care | Payer: Medicare Other | Source: Ambulatory Visit | Attending: Gynecologic Oncology | Admitting: Gynecologic Oncology

## 2020-03-08 DIAGNOSIS — Z20822 Contact with and (suspected) exposure to covid-19: Secondary | ICD-10-CM | POA: Insufficient documentation

## 2020-03-08 DIAGNOSIS — Z01812 Encounter for preprocedural laboratory examination: Secondary | ICD-10-CM | POA: Diagnosis present

## 2020-03-09 LAB — SARS CORONAVIRUS 2 (TAT 6-24 HRS): SARS Coronavirus 2: NEGATIVE

## 2020-03-11 ENCOUNTER — Telehealth: Payer: Self-pay

## 2020-03-11 NOTE — Telephone Encounter (Signed)
Catherine Lynn called stating that she took the mag citrateat 2 pm.  She has had small bowel movements.  She just vomited x 2.  She wanted to know if she should take the 2 pm ATB tabs prescribed for 2 pm. Told her that she needed to continue with the bowel prep as scheduled.  Dr. Berline Lopes said to just do the best she can. Pt verbalized understanding

## 2020-03-11 NOTE — Telephone Encounter (Signed)
Catherine Lynn states that she has all her instructions and medications in line to do bowel prep and shower this evening. She has no questions at this time.

## 2020-03-11 NOTE — Anesthesia Preprocedure Evaluation (Addendum)
Anesthesia Evaluation  Patient identified by MRN, date of birth, ID band Patient awake    Reviewed: Allergy & Precautions, NPO status , Patient's Chart, lab work & pertinent test results  Airway Mallampati: II       Dental  (+) Edentulous Upper, Edentulous Lower   Pulmonary former smoker,    Pulmonary exam normal breath sounds clear to auscultation       Cardiovascular negative cardio ROS Normal cardiovascular exam Rhythm:Regular Rate:Normal     Neuro/Psych negative neurological ROS  negative psych ROS   GI/Hepatic negative GI ROS, Neg liver ROS,   Endo/Other  negative endocrine ROS  Renal/GU negative Renal ROS  negative genitourinary   Musculoskeletal negative musculoskeletal ROS (+)   Abdominal (+) + obese,   Peds  Hematology  (+) anemia ,   Anesthesia Other Findings   Reproductive/Obstetrics                           Anesthesia Physical Anesthesia Plan  ASA: II  Anesthesia Plan: General   Post-op Pain Management:    Induction: Intravenous  PONV Risk Score and Plan: 4 or greater and Ondansetron, Dexamethasone and Midazolam  Airway Management Planned: Oral ETT  Additional Equipment: None  Intra-op Plan:   Post-operative Plan: Extubation in OR  Informed Consent: I have reviewed the patients History and Physical, chart, labs and discussed the procedure including the risks, benefits and alternatives for the proposed anesthesia with the patient or authorized representative who has indicated his/her understanding and acceptance.     Dental advisory given  Plan Discussed with: CRNA  Anesthesia Plan Comments:        Anesthesia Quick Evaluation

## 2020-03-12 ENCOUNTER — Ambulatory Visit (HOSPITAL_COMMUNITY): Payer: Medicare Other | Admitting: Anesthesiology

## 2020-03-12 ENCOUNTER — Encounter (HOSPITAL_COMMUNITY): Payer: Self-pay | Admitting: Gynecologic Oncology

## 2020-03-12 ENCOUNTER — Other Ambulatory Visit: Payer: Self-pay

## 2020-03-12 ENCOUNTER — Ambulatory Visit: Payer: Medicare Other | Admitting: Gynecologic Oncology

## 2020-03-12 ENCOUNTER — Inpatient Hospital Stay (HOSPITAL_COMMUNITY)
Admission: AD | Admit: 2020-03-12 | Discharge: 2020-03-14 | DRG: 743 | Disposition: A | Discharge status: Home / Self Care | Payer: Medicare Other | Attending: Gynecologic Oncology | Admitting: Gynecologic Oncology

## 2020-03-12 ENCOUNTER — Encounter (HOSPITAL_COMMUNITY)
Admission: AD | Disposition: A | Discharge status: Home / Self Care | Payer: Self-pay | Source: Home / Self Care | Attending: Gynecologic Oncology

## 2020-03-12 DIAGNOSIS — R6881 Early satiety: Secondary | ICD-10-CM | POA: Diagnosis present

## 2020-03-12 DIAGNOSIS — Z6835 Body mass index (BMI) 35.0-35.9, adult: Secondary | ICD-10-CM | POA: Diagnosis not present

## 2020-03-12 DIAGNOSIS — N739 Female pelvic inflammatory disease, unspecified: Secondary | ICD-10-CM | POA: Diagnosis present

## 2020-03-12 DIAGNOSIS — K08109 Complete loss of teeth, unspecified cause, unspecified class: Secondary | ICD-10-CM | POA: Diagnosis present

## 2020-03-12 DIAGNOSIS — E669 Obesity, unspecified: Secondary | ICD-10-CM | POA: Diagnosis present

## 2020-03-12 DIAGNOSIS — Z23 Encounter for immunization: Secondary | ICD-10-CM

## 2020-03-12 DIAGNOSIS — Z87891 Personal history of nicotine dependence: Secondary | ICD-10-CM

## 2020-03-12 DIAGNOSIS — K5900 Constipation, unspecified: Secondary | ICD-10-CM | POA: Diagnosis present

## 2020-03-12 DIAGNOSIS — N9489 Other specified conditions associated with female genital organs and menstrual cycle: Secondary | ICD-10-CM

## 2020-03-12 DIAGNOSIS — R1900 Intra-abdominal and pelvic swelling, mass and lump, unspecified site: Secondary | ICD-10-CM

## 2020-03-12 DIAGNOSIS — N7093 Salpingitis and oophoritis, unspecified: Principal | ICD-10-CM | POA: Diagnosis present

## 2020-03-12 HISTORY — PX: ROBOTIC ASSISTED SALPINGO OOPHERECTOMY: SHX6082

## 2020-03-12 LAB — TYPE AND SCREEN
ABO/RH(D): O POS
Antibody Screen: NEGATIVE

## 2020-03-12 SURGERY — SALPINGO-OOPHORECTOMY, ROBOT-ASSISTED
Anesthesia: General

## 2020-03-12 MED ORDER — MEPERIDINE HCL 50 MG/ML IJ SOLN: 6.2500 mg | INTRAMUSCULAR | Status: DC | PRN

## 2020-03-12 MED ORDER — ROCURONIUM BROMIDE 10 MG/ML (PF) SYRINGE
PREFILLED_SYRINGE | INTRAVENOUS | Status: AC
  Filled 2020-03-12: qty 10

## 2020-03-12 MED ORDER — ENSURE PRE-SURGERY PO LIQD
592.0000 mL | Freq: Once | ORAL | Status: DC
  Filled 2020-03-12: qty 592

## 2020-03-12 MED ORDER — LIDOCAINE 2% (20 MG/ML) 5 ML SYRINGE
INTRAMUSCULAR | Status: AC
  Filled 2020-03-12: qty 5

## 2020-03-12 MED ORDER — HEPARIN SODIUM (PORCINE) 5000 UNIT/ML IJ SOLN
5000.0000 [IU] | INTRAMUSCULAR | Status: AC
  Administered 2020-03-12: 06:00:00 5000 [IU] via SUBCUTANEOUS
  Filled 2020-03-12: qty 1

## 2020-03-12 MED ORDER — ROCURONIUM BROMIDE 10 MG/ML (PF) SYRINGE
PREFILLED_SYRINGE | INTRAVENOUS | Status: DC | PRN
  Administered 2020-03-12: 20 mg via INTRAVENOUS
  Administered 2020-03-12: 100 mg via INTRAVENOUS

## 2020-03-12 MED ORDER — BUPIVACAINE HCL 0.25 % IJ SOLN
INTRAMUSCULAR | Status: AC
  Filled 2020-03-12: qty 1

## 2020-03-12 MED ORDER — ONDANSETRON HCL 4 MG/2ML IJ SOLN
INTRAMUSCULAR | Status: AC
  Filled 2020-03-12: qty 2

## 2020-03-12 MED ORDER — ONDANSETRON HCL 4 MG/2ML IJ SOLN: 4.0000 mg | Freq: Four times a day (QID) | INTRAMUSCULAR | Status: DC | PRN

## 2020-03-12 MED ORDER — LACTATED RINGERS IR SOLN
Status: DC | PRN
  Administered 2020-03-12: 1000 mL

## 2020-03-12 MED ORDER — BUPIVACAINE HCL 0.25 % IJ SOLN
INTRAMUSCULAR | Status: DC | PRN
  Administered 2020-03-12: 50 mL

## 2020-03-12 MED ORDER — ACETAMINOPHEN 500 MG PO TABS
1000.0000 mg | ORAL_TABLET | Freq: Two times a day (BID) | ORAL | Status: DC
  Administered 2020-03-12 – 2020-03-14 (×4): 1000 mg via ORAL
  Filled 2020-03-12 (×4): qty 2

## 2020-03-12 MED ORDER — STERILE WATER FOR IRRIGATION IR SOLN
Status: DC | PRN
  Administered 2020-03-12: 1000 mL

## 2020-03-12 MED ORDER — PNEUMOCOCCAL VAC POLYVALENT 25 MCG/0.5ML IJ INJ
0.5000 mL | INJECTION | INTRAMUSCULAR | Status: AC
  Administered 2020-03-13: 10:00:00 0.5 mL via INTRAMUSCULAR
  Filled 2020-03-12: qty 0.5

## 2020-03-12 MED ORDER — LIDOCAINE 2% (20 MG/ML) 5 ML SYRINGE
INTRAMUSCULAR | Status: DC | PRN
  Administered 2020-03-12: 100 mg via INTRAVENOUS

## 2020-03-12 MED ORDER — FENTANYL CITRATE (PF) 250 MCG/5ML IJ SOLN
INTRAMUSCULAR | Status: DC | PRN
  Administered 2020-03-12 (×2): 50 ug via INTRAVENOUS
  Administered 2020-03-12: 100 ug via INTRAVENOUS
  Administered 2020-03-12: 50 ug via INTRAVENOUS

## 2020-03-12 MED ORDER — DEXAMETHASONE SODIUM PHOSPHATE 10 MG/ML IJ SOLN
INTRAMUSCULAR | Status: DC | PRN
  Administered 2020-03-12: 10 mg via INTRAVENOUS

## 2020-03-12 MED ORDER — LIDOCAINE HCL 2 % IJ SOLN
INTRAMUSCULAR | Status: AC
  Filled 2020-03-12: qty 20

## 2020-03-12 MED ORDER — PROPOFOL 10 MG/ML IV BOLUS
INTRAVENOUS | Status: DC | PRN
  Administered 2020-03-12: 120 mg via INTRAVENOUS

## 2020-03-12 MED ORDER — CHLORHEXIDINE GLUCONATE CLOTH 2 % EX PADS
6.0000 | MEDICATED_PAD | Freq: Every day | CUTANEOUS | Status: DC
  Administered 2020-03-12: 6 via TOPICAL

## 2020-03-12 MED ORDER — GABAPENTIN 100 MG PO CAPS
200.0000 mg | ORAL_CAPSULE | ORAL | Status: AC
  Administered 2020-03-12: 06:00:00 200 mg via ORAL
  Filled 2020-03-12: qty 2

## 2020-03-12 MED ORDER — POTASSIUM CHLORIDE IN NACL 20-0.45 MEQ/L-% IV SOLN
INTRAVENOUS | Status: DC
  Filled 2020-03-12 (×2): qty 1000

## 2020-03-12 MED ORDER — SENNOSIDES-DOCUSATE SODIUM 8.6-50 MG PO TABS
2.0000 | ORAL_TABLET | Freq: Every day | ORAL | Status: DC
  Administered 2020-03-12 – 2020-03-13 (×2): 2 via ORAL
  Filled 2020-03-12 (×2): qty 2

## 2020-03-12 MED ORDER — METRONIDAZOLE IN NACL 5-0.79 MG/ML-% IV SOLN
500.0000 mg | Freq: Once | INTRAVENOUS | Status: AC
  Administered 2020-03-12: 08:00:00 500 mg via INTRAVENOUS
  Filled 2020-03-12: qty 100

## 2020-03-12 MED ORDER — ONDANSETRON HCL 4 MG PO TABS: 4.0000 mg | ORAL_TABLET | Freq: Four times a day (QID) | ORAL | Status: DC | PRN

## 2020-03-12 MED ORDER — GABAPENTIN 100 MG PO CAPS
100.0000 mg | ORAL_CAPSULE | Freq: Three times a day (TID) | ORAL | Status: DC
  Administered 2020-03-12 – 2020-03-14 (×6): 100 mg via ORAL
  Filled 2020-03-12 (×6): qty 1

## 2020-03-12 MED ORDER — METRONIDAZOLE IN NACL 5-0.79 MG/ML-% IV SOLN
500.0000 mg | Freq: Three times a day (TID) | INTRAVENOUS | Status: DC
  Administered 2020-03-12 – 2020-03-14 (×6): 500 mg via INTRAVENOUS
  Filled 2020-03-12 (×6): qty 100

## 2020-03-12 MED ORDER — LEVOFLOXACIN IN D5W 500 MG/100ML IV SOLN
500.0000 mg | INTRAVENOUS | Status: DC
  Administered 2020-03-12 – 2020-03-14 (×3): 500 mg via INTRAVENOUS
  Filled 2020-03-12 (×3): qty 100

## 2020-03-12 MED ORDER — DEXAMETHASONE SODIUM PHOSPHATE 10 MG/ML IJ SOLN
INTRAMUSCULAR | Status: AC
  Filled 2020-03-12: qty 1

## 2020-03-12 MED ORDER — HYDROMORPHONE HCL 1 MG/ML IJ SOLN
0.5000 mg | INTRAMUSCULAR | Status: DC | PRN
  Administered 2020-03-12: 0.5 mg via INTRAVENOUS
  Filled 2020-03-12: qty 0.5

## 2020-03-12 MED ORDER — ENOXAPARIN SODIUM 40 MG/0.4ML ~~LOC~~ SOLN
40.0000 mg | SUBCUTANEOUS | Status: DC
  Administered 2020-03-13 – 2020-03-14 (×2): 40 mg via SUBCUTANEOUS
  Filled 2020-03-12 (×2): qty 0.4

## 2020-03-12 MED ORDER — LIDOCAINE 20MG/ML (2%) 15 ML SYRINGE OPTIME
INTRAMUSCULAR | Status: DC | PRN
Start: 2020-03-12 — End: 2020-03-12
  Administered 2020-03-12: 1.5 mg/kg/h via INTRAVENOUS

## 2020-03-12 MED ORDER — ONDANSETRON HCL 4 MG/2ML IJ SOLN
INTRAMUSCULAR | Status: DC | PRN
  Administered 2020-03-12: 4 mg via INTRAVENOUS

## 2020-03-12 MED ORDER — ACETAMINOPHEN 500 MG PO TABS
1000.0000 mg | ORAL_TABLET | ORAL | Status: AC
  Administered 2020-03-12: 06:00:00 1000 mg via ORAL
  Filled 2020-03-12: qty 2

## 2020-03-12 MED ORDER — IBUPROFEN 200 MG PO TABS
600.0000 mg | ORAL_TABLET | Freq: Four times a day (QID) | ORAL | Status: DC
  Administered 2020-03-13 – 2020-03-14 (×5): 600 mg via ORAL
  Filled 2020-03-12 (×5): qty 3

## 2020-03-12 MED ORDER — LEVOFLOXACIN IN D5W 750 MG/150ML IV SOLN: 750.0000 mg | INTRAVENOUS | Status: DC

## 2020-03-12 MED ORDER — LACTATED RINGERS IV SOLN: INTRAVENOUS | Status: DC

## 2020-03-12 MED ORDER — OXYCODONE HCL 5 MG PO TABS: 5.0000 mg | ORAL_TABLET | ORAL | Status: DC | PRN

## 2020-03-12 MED ORDER — DEXAMETHASONE SODIUM PHOSPHATE 4 MG/ML IJ SOLN: 4.0000 mg | INTRAMUSCULAR | Status: DC

## 2020-03-12 MED ORDER — PHENYLEPHRINE 40 MCG/ML (10ML) SYRINGE FOR IV PUSH (FOR BLOOD PRESSURE SUPPORT)
PREFILLED_SYRINGE | INTRAVENOUS | Status: DC | PRN
  Administered 2020-03-12 (×3): 80 ug via INTRAVENOUS

## 2020-03-12 MED ORDER — PROPOFOL 10 MG/ML IV BOLUS
INTRAVENOUS | Status: AC
  Filled 2020-03-12: qty 40

## 2020-03-12 MED ORDER — PROMETHAZINE HCL 25 MG/ML IJ SOLN: 6.2500 mg | INTRAMUSCULAR | Status: DC | PRN

## 2020-03-12 MED ORDER — HYDROMORPHONE HCL 1 MG/ML IJ SOLN: 0.2500 mg | INTRAMUSCULAR | Status: DC | PRN

## 2020-03-12 MED ORDER — MIDAZOLAM HCL 2 MG/2ML IJ SOLN
INTRAMUSCULAR | Status: AC
  Filled 2020-03-12: qty 2

## 2020-03-12 MED ORDER — KETOROLAC TROMETHAMINE 30 MG/ML IJ SOLN: 15.0000 mg | Freq: Once | INTRAMUSCULAR | Status: DC | PRN

## 2020-03-12 MED ORDER — SUGAMMADEX SODIUM 200 MG/2ML IV SOLN
INTRAVENOUS | Status: DC | PRN
  Administered 2020-03-12: 400 mg via INTRAVENOUS

## 2020-03-12 MED ORDER — KETAMINE HCL 10 MG/ML IJ SOLN
INTRAMUSCULAR | Status: AC
  Filled 2020-03-12: qty 1

## 2020-03-12 MED ORDER — TRAMADOL HCL 50 MG PO TABS: 100.0000 mg | ORAL_TABLET | Freq: Two times a day (BID) | ORAL | Status: DC | PRN

## 2020-03-12 MED ORDER — METRONIDAZOLE IN NACL 5-0.79 MG/ML-% IV SOLN
500.0000 mg | Freq: Three times a day (TID) | INTRAVENOUS | Status: DC
Start: 2020-03-12 — End: 2020-03-12

## 2020-03-12 MED ORDER — ENSURE PRE-SURGERY PO LIQD
296.0000 mL | Freq: Once | ORAL | Status: DC
  Filled 2020-03-12: qty 296

## 2020-03-12 MED ORDER — FENTANYL CITRATE (PF) 250 MCG/5ML IJ SOLN
INTRAMUSCULAR | Status: AC
  Filled 2020-03-12: qty 5

## 2020-03-12 MED ORDER — MIDAZOLAM HCL 5 MG/5ML IJ SOLN
INTRAMUSCULAR | Status: DC | PRN
  Administered 2020-03-12: 2 mg via INTRAVENOUS

## 2020-03-12 MED ORDER — PHENYLEPHRINE 40 MCG/ML (10ML) SYRINGE FOR IV PUSH (FOR BLOOD PRESSURE SUPPORT)
PREFILLED_SYRINGE | INTRAVENOUS | Status: AC
  Filled 2020-03-12: qty 10

## 2020-03-12 MED ORDER — SCOPOLAMINE 1 MG/3DAYS TD PT72
1.0000 | MEDICATED_PATCH | TRANSDERMAL | Status: DC
  Administered 2020-03-12: 06:00:00 1.5 mg via TRANSDERMAL
  Filled 2020-03-12: qty 1

## 2020-03-12 MED ORDER — KETAMINE HCL 10 MG/ML IJ SOLN
INTRAMUSCULAR | Status: DC | PRN
Start: 2020-03-12 — End: 2020-03-12
  Administered 2020-03-12: 30 mg via INTRAVENOUS

## 2020-03-12 SURGICAL SUPPLY — 73 items
ADH SKN CLS APL DERMABOND .7 (GAUZE/BANDAGES/DRESSINGS) ×1
AGENT HMST KT MTR STRL THRMB (HEMOSTASIS)
APL ESCP 34 STRL LF DISP (HEMOSTASIS)
APPLICATOR SURGIFLO ENDO (HEMOSTASIS) IMPLANT
BAG LAPAROSCOPIC 12 15 PORT 16 (BASKET) IMPLANT
BAG RETRIEVAL 12/15 (BASKET)
BAG SPEC RTRVL LRG 6X4 10 (ENDOMECHANICALS)
BLADE SURG SZ10 CARB STEEL (BLADE) IMPLANT
CATH ROBINSON RED A/P 18FR (CATHETERS) ×2 IMPLANT
COVER BACK TABLE 60X90IN (DRAPES) ×2 IMPLANT
COVER TIP SHEARS 8 DVNC (MISCELLANEOUS) ×1 IMPLANT
COVER TIP SHEARS 8MM DA VINCI (MISCELLANEOUS) ×1
COVER WAND RF STERILE (DRAPES) IMPLANT
DECANTER SPIKE VIAL GLASS SM (MISCELLANEOUS) IMPLANT
DERMABOND ADVANCED (GAUZE/BANDAGES/DRESSINGS) ×1
DERMABOND ADVANCED .7 DNX12 (GAUZE/BANDAGES/DRESSINGS) ×1 IMPLANT
DRAIN CHANNEL 15F RND FF 3/16 (WOUND CARE) ×4 IMPLANT
DRAPE ARM DVNC X/XI (DISPOSABLE) ×4 IMPLANT
DRAPE COLUMN DVNC XI (DISPOSABLE) ×1 IMPLANT
DRAPE DA VINCI XI ARM (DISPOSABLE) ×4
DRAPE DA VINCI XI COLUMN (DISPOSABLE) ×1
DRAPE SHEET LG 3/4 BI-LAMINATE (DRAPES) ×2 IMPLANT
DRAPE SURG IRRIG POUCH 19X23 (DRAPES) ×2 IMPLANT
DRSG OPSITE POSTOP 4X6 (GAUZE/BANDAGES/DRESSINGS) IMPLANT
DRSG OPSITE POSTOP 4X8 (GAUZE/BANDAGES/DRESSINGS) IMPLANT
DRSG TEGADERM 4X4.75 (GAUZE/BANDAGES/DRESSINGS) ×4 IMPLANT
ELECT REM PT RETURN 15FT ADLT (MISCELLANEOUS) ×2 IMPLANT
EVACUATOR SILICONE 100CC (DRAIN) ×4 IMPLANT
GLOVE BIO SURGEON STRL SZ 6 (GLOVE) ×8 IMPLANT
GLOVE BIO SURGEON STRL SZ 6.5 (GLOVE) IMPLANT
GOWN STRL REUS W/ TWL LRG LVL3 (GOWN DISPOSABLE) ×4 IMPLANT
GOWN STRL REUS W/TWL LRG LVL3 (GOWN DISPOSABLE) ×8
HOLDER FOLEY CATH W/STRAP (MISCELLANEOUS) ×2 IMPLANT
IRRIG SUCT STRYKERFLOW 2 WTIP (MISCELLANEOUS) ×2
IRRIGATION SUCT STRKRFLW 2 WTP (MISCELLANEOUS) ×1 IMPLANT
KIT PROCEDURE DA VINCI SI (MISCELLANEOUS)
KIT PROCEDURE DVNC SI (MISCELLANEOUS) IMPLANT
KIT TURNOVER KIT A (KITS) IMPLANT
MANIPULATOR UTERINE 4.5 ZUMI (MISCELLANEOUS) ×2 IMPLANT
NEEDLE HYPO 21X1.5 SAFETY (NEEDLE) ×2 IMPLANT
NEEDLE SPNL 18GX3.5 QUINCKE PK (NEEDLE) IMPLANT
OBTURATOR OPTICAL STANDARD 8MM (TROCAR) ×1
OBTURATOR OPTICAL STND 8 DVNC (TROCAR) ×1
OBTURATOR OPTICALSTD 8 DVNC (TROCAR) ×1 IMPLANT
PACK ROBOT GYN WLCUSTOM (TRAY / TRAY PROCEDURE) ×2 IMPLANT
PAD POSITIONING PINK XL (MISCELLANEOUS) ×2 IMPLANT
PENCIL SMOKE EVACUATOR (MISCELLANEOUS) IMPLANT
PORT ACCESS TROCAR AIRSEAL 12 (TROCAR) ×1 IMPLANT
PORT ACCESS TROCAR AIRSEAL 5M (TROCAR) ×1
POUCH SPECIMEN RETRIEVAL 10MM (ENDOMECHANICALS) IMPLANT
SCRUB CHG 4% DYNA-HEX 4OZ (MISCELLANEOUS) ×2 IMPLANT
SEAL CANN UNIV 5-8 DVNC XI (MISCELLANEOUS) ×3 IMPLANT
SEAL XI 5MM-8MM UNIVERSAL (MISCELLANEOUS) ×3
SET IRRIG Y TYPE TUR BLADDER L (SET/KITS/TRAYS/PACK) ×2 IMPLANT
SET TRI-LUMEN FLTR TB AIRSEAL (TUBING) ×2 IMPLANT
SPONGE LAP 18X18 RF (DISPOSABLE) IMPLANT
SURGIFLO W/THROMBIN 8M KIT (HEMOSTASIS) IMPLANT
SUT ETHILON 3 0 PS 1 (SUTURE) ×4 IMPLANT
SUT MNCRL AB 4-0 PS2 18 (SUTURE) IMPLANT
SUT PDS AB 1 TP1 96 (SUTURE) IMPLANT
SUT VIC AB 0 CT1 27 (SUTURE)
SUT VIC AB 0 CT1 27XBRD ANTBC (SUTURE) IMPLANT
SUT VIC AB 2-0 CT1 27 (SUTURE)
SUT VIC AB 2-0 CT1 TAPERPNT 27 (SUTURE) IMPLANT
SUT VICRYL 4-0 PS2 18IN ABS (SUTURE) ×4 IMPLANT
SYR 10ML LL (SYRINGE) IMPLANT
TOWEL OR NON WOVEN STRL DISP B (DISPOSABLE) ×2 IMPLANT
TRAP SPECIMEN MUCUS 40CC (MISCELLANEOUS) IMPLANT
TRAY FOLEY MTR SLVR 16FR STAT (SET/KITS/TRAYS/PACK) ×2 IMPLANT
TROCAR XCEL NON-BLD 5MMX100MML (ENDOMECHANICALS) IMPLANT
UNDERPAD 30X36 HEAVY ABSORB (UNDERPADS AND DIAPERS) ×2 IMPLANT
WATER STERILE IRR 1000ML POUR (IV SOLUTION) ×2 IMPLANT
YANKAUER SUCT BULB TIP 10FT TU (MISCELLANEOUS) IMPLANT

## 2020-03-12 NOTE — Transfer of Care (Signed)
Immediate Anesthesia Transfer of Care Note  Patient: Catherine Lynn  Procedure(s) Performed: XI ROBOTIC ASSISTED TOTAL HYSTERECTOMY WITH BILATERAL SALPINGOOPHORECTOMY (N/A )  Patient Location: PACU  Anesthesia Type:General  Level of Consciousness: awake, alert  and oriented  Airway & Oxygen Therapy: Patient Spontanous Breathing and Patient connected to face mask oxygen  Post-op Assessment: Report given to RN and Post -op Vital signs reviewed and stable  Post vital signs: Reviewed and stable  Last Vitals:  Vitals Value Taken Time  BP    Temp    Pulse 74 03/12/20 1040  Resp 25 03/12/20 1040  SpO2 100 % 03/12/20 1040  Vitals shown include unvalidated device data.  Last Pain:  Vitals:   03/12/20 0538  TempSrc: Oral         Complications: No apparent anesthesia complications

## 2020-03-12 NOTE — Brief Op Note (Signed)
03/12/2020  10:28 AM  PATIENT:  Catherine Lynn  69 y.o. female  PRE-OPERATIVE DIAGNOSIS:  PELVIC MASS  POST-OPERATIVE DIAGNOSIS:  PELVIC MASS, TOA, PID  PROCEDURE:  Procedure(s): XI ROBOTIC ASSISTED TOTAL HYSTERECTOMY WITH BILATERAL SALPINGOOPHORECTOMY (N/A)  SURGEON:  Surgeon(s) and Role:    [name], MD - Primary    * Lahoma Crocker, MD - Assisting    * Everitt Amber, MD - Assisting  ANESTHESIA:   general  EBL:  100 mL   BLOOD ADMINISTERED:none  DRAINS: (2) Jackson-Pratt drain(s) with closed bulb suction in the pelvis (bilateral drains)   LOCAL MEDICATIONS USED:  MARCAINE     SPECIMEN: uterus, cervix, bilateral tubes and ovaries  DISPOSITION OF SPECIMEN:  PATHOLOGY  COUNTS:  YES  TOURNIQUET:  * No tourniquets in log *  DICTATION: .Note written in EPIC  PLAN OF CARE: Admit for observation  PATIENT DISPOSITION:  PACU - hemodynamically stable.   Delay start of Pharmacological VTE agent (>24hrs) due to surgical blood loss or risk of bleeding: yes

## 2020-03-12 NOTE — Interval H&P Note (Signed)
History and Physical Interval Note:  03/12/2020 7:05 AM  Catherine Lynn  has presented today for surgery, with the diagnosis of PELVIC MASS.  The various methods of treatment have been discussed with the patient and family. After consideration of risks, benefits and other options for treatment, the patient has consented to  Procedure(s): XI ROBOTIC ASSISTED SALPINGO OOPHORECTOMY AND POSSIBLE LAPAROSCOPIC HYSTERECTOMY AND POSSIBLE STAGING AND POSSIBLE BOWEL RESECTION (N/A) as a surgical intervention.  The patient's history has been reviewed, patient examined, no change in status, stable for surgery.  I have reviewed the patient's chart and labs.  Questions were answered to the patient's satisfaction.     [name]

## 2020-03-12 NOTE — Op Note (Signed)
OPERATIVE NOTE  Pre-operative Diagnosis: Adnexal mass  Post-operative Diagnosis: same, TOA, [place]  Operation: Robotic-assisted laparoscopic total hysterectomy with bilateral salpingoophorectomy, right ureterolysis, lysis of adhesions for approximately 45 minutes  Surgeon: Jeral Pinch MD  Assistant Surgeon: Lahoma Crocker, MD (an MD assistant was necessary for tissue manipulation, management of robotic instrumentation, retraction and positioning due to the complexity of the case and hospital policies); Everitt Amber, MD  Anesthesia: GET  Urine Output: 100 cc  Operative Findings:  On EUA, limited mobility of uterus/pelvic mass complex. Cervix flush with the vagina and os stenotic, manipulator not able to be placed. On intra-abdominal entry, adhesions between the liver edge and diaphragm noted, otherwise normal upper abdominal survey. Some filmy adhesions of the omentum to the left sidewall. Right ovary replaced by a thick-walled abscess with intra-op rupture of green tinged purulent fluid. Ovary 8-10cm in size with dilated fallopian tube and adherent to the right sidewall, uterus and sigmoid mesentery. Loop of small bowel adherent to the anterior surface of the mass. Normal appearing left adnexa. Uterus 6-8cm with plane between the bladder and uterus obscured to do abscess involvement of an area on the bladder peritoneum on the right. Right ureter intimately associated with the mass, mildly dilated. Bladder backfilled at the end of surgery with no leak noted. Bubble test done to assess integrity of sigmoid and rectum with no leak appreciated. Abscess/ovarian rind left on the right aspect of the bladder peritoneum, right sidewall and colonic mesentery.Bilateral JP drains placed in the pelvis, exiting through lateral ports.   Estimated Blood Loss:  less than 100 mL      Total IV Fluids: 1300 ml         Specimens: uterus, cervix, bilateral tubes and ovaries         Complications:   None apparent; patient tolerated the procedure well.         Disposition: PACU - hemodynamically stable.  Procedure Details  The patient was seen in the Holding Room. The risks, benefits, complications, treatment options, and expected outcomes were discussed with the patient.  The patient concurred with the proposed plan, giving informed consent.  The site of surgery properly noted/marked. The patient was identified as Catherine Lynn and the procedure verified as a Robotic-assisted hysterectomy with bilateral salpingo oophorectomy.   After induction of anesthesia, the patient was draped and prepped in the usual sterile manner. Patient was placed in supine position after anesthesia and draped and prepped in the usual sterile manner as follows: Her arms were tucked to her side with all appropriate precautions.  The shoulders were stabilized with padded shoulder blocks applied to the acromium processes.  The patient was placed in the semi-lithotomy position in [place].  The perineum and vagina were prepped with CholoraPrep. The patient was draped after the CholoraPrep had been allowed to dry for 3 minutes.  A Time Out was held and the above information confirmed.  The urethra was prepped with Betadine. Foley catheter was placed.  A sterile speculum was placed in the vagina.  The cervix was attempted to be dilated but significant stenosis of the cervix was encountered. OG tube placement was confirmed and to suction.   Next, a 10 mm skin incision was made 1 cm below the subcostal margin in the midclavicular line.  The 5 mm Optiview port and scope was used for direct entry.  Opening pressure was under 10 mm CO2.  The abdomen was insufflated and the findings were noted as above.  At this point and all points during the procedure, the patient's intra-abdominal pressure did not exceed 15 mmHg. Next, an 8 mm skin incision was made superior to the umbilicus and a right and left port were placed about 8 cm  lateral to the robot port on the right and left side.  A fourth arm was placed on the right.  The 5 mm assist trocar was exchanged for a 10-12 mm port. All ports were placed under direct visualization.  The patient was placed in steep Trendelenburg.    Abdominal and pelvic findings as noted above.  Attention was first turned to the small bowel.  The loop of small bowel that was adherent to the ovarian mass and uterus was bluntly dissected free.  The right peritoneum was opened parallel to the IP ligament to open the retroperitoneal space.  The round ligament was transected.  The ureter was visualized on the medial leaf of the broad ligament.  The peritoneum above the ureter was incised and stretched and the infundibulopelvic ligament was skeletonized, cauterized and cut.  Attention was then turned posteriorly and sharp and blunt dissection was used to carefully delineate a plane between the right ovarian abscess and colonic mesentery.  During this dissection, the mass was ruptured with drainage of purulent fluid.  The left peritoneum was opened parallel to the IP ligament to open the retroperitoneal spaces bilaterally. The round ligament was transected. The ureter was noted to be on the medial leaf of the broad ligament.  The peritoneum above the ureter was incised and stretched and the infundibulopelvic ligament was skeletonized, cauterized and cut.    The posterior peritoneum was taken down to the level of the EEA sizer after the colon and mesentery have been freed to below the level of the cervicovaginal junction.  The anterior peritoneum was also taken down.  Bladder flap was first developed on the left where the bladder was adherent to the lower uterine segment.  This plane was bluntly dissected until the EEA sizer could be visualized at the cervicovaginal junction anteriorly.  The uterine artery on the left was skeletonized, cauterized and cut.  Attention was then turned on the right where the abscess  that extended anteriorly onto the bladder peritoneum was opened and drained.  The bladder flap was developed on this side. The uterine artery on the right side was skeletonized, cauterized and cut in the normal manner.   The colpotomy was made and the uterus, cervix, bilateral ovaries and tubes were amputated and delivered through the vagina.  Pedicles were inspected and excellent hemostasis was achieved.    The colpotomy at the vaginal cuff was closed with Vicryl on a CT1 needle in a running manner.  Copious irrigation was used and excellent hemostasis was achieved.  Given the proximity of the abscess to the bladder, the bladder was backfilled with approximately 200 cc of sterile water with no leak noted.  Additionally, given adhesions of the colon and colonic mesentery to the posterior aspect of the right tubo-ovarian abscess, a bubble test was performed with no evidence of leak. At this point in the procedure was completed.   Robotic instruments were removed under direct visulaization.  Two 24F JP drains were placed into the pelvis from the lateral trocar sites under direct surveillance prior to removal of ports. The robot was undocked. Drains were secured to the skin with 3-0 Nylon suture. The fascia at the 10-12 mm port was closed with 0 Vicryl on a UR-5 needle.  The  subcuticular tissue was closed with 4-0 Vicryl and the skin was closed with 4-0 Monocryl in a subcuticular manner.  Dermabond was applied.  Drain port sites were dressed with drain bandages.  The vagina was swabbed with  minimal bleeding noted.   All sponge, lap and needle counts were correct x  3.   The patient was transferred to the recovery room in stable condition.  Jeral Pinch, MD

## 2020-03-12 NOTE — Anesthesia Procedure Notes (Signed)
Procedure Name: Intubation Date/Time: 03/12/2020 7:40 AM Performed by: Talbot Grumbling, CRNA Pre-anesthesia Checklist: Patient identified, Emergency Drugs available, Suction available and Patient being monitored Patient Re-evaluated:Patient Re-evaluated prior to induction Oxygen Delivery Method: Circle system utilized Preoxygenation: Pre-oxygenation with 100% oxygen Induction Type: IV induction Ventilation: Mask ventilation without difficulty Laryngoscope Size: Mac and 3 Grade View: Grade I Tube type: Oral Tube size: 7.5 mm Number of attempts: 1 Airway Equipment and Method: Stylet Placement Confirmation: ETT inserted through vocal cords under direct vision,  positive ETCO2 and breath sounds checked- equal and bilateral Secured at: 21 cm Tube secured with: Tape Dental Injury: Teeth and Oropharynx as per pre-operative assessment

## 2020-03-13 ENCOUNTER — Encounter: Payer: Self-pay | Admitting: *Deleted

## 2020-03-13 LAB — CBC
HCT: 28.2 % — ABNORMAL LOW (ref 36.0–46.0)
Hemoglobin: 9.1 g/dL — ABNORMAL LOW (ref 12.0–15.0)
MCH: 23.3 pg — ABNORMAL LOW (ref 26.0–34.0)
MCHC: 32.3 g/dL (ref 30.0–36.0)
MCV: 72.1 fL — ABNORMAL LOW (ref 80.0–100.0)
Platelets: 465 10*3/uL — ABNORMAL HIGH (ref 150–400)
RBC: 3.91 MIL/uL (ref 3.87–5.11)
RDW: 19.4 % — ABNORMAL HIGH (ref 11.5–15.5)
WBC: 17.6 10*3/uL — ABNORMAL HIGH (ref 4.0–10.5)
nRBC: 0 % (ref 0.0–0.2)

## 2020-03-13 LAB — BASIC METABOLIC PANEL
Anion gap: 8 (ref 5–15)
BUN: 9 mg/dL (ref 8–23)
CO2: 24 mmol/L (ref 22–32)
Calcium: 8.6 mg/dL — ABNORMAL LOW (ref 8.9–10.3)
Chloride: 103 mmol/L (ref 98–111)
Creatinine, Ser: 0.79 mg/dL (ref 0.44–1.00)
GFR calc Af Amer: >60 mL/min (ref >60)
GFR calc non Af Amer: >60 mL/min (ref >60)
Glucose, Bld: 153 mg/dL — ABNORMAL HIGH (ref 70–99)
Potassium: 4.6 mmol/L (ref 3.5–5.1)
Sodium: 135 mmol/L (ref 135–145)

## 2020-03-13 LAB — SURGICAL PATHOLOGY

## 2020-03-13 LAB — CYTOLOGY - NON PAP

## 2020-03-13 NOTE — Progress Notes (Signed)
1 Day Post-Op Procedure(s) (LRB): XI ROBOTIC ASSISTED TOTAL HYSTERECTOMY WITH BILATERAL SALPINGOOPHORECTOMY (N/A)  Subjective: Patient reports doing well this am.  Minimal incisional pain reported.  Tolerating diet with no nausea or emesis.  Sat in the chair and tolerated well. Denies chest pain, dyspnea, passing flatus, or having a bowel movement. Stating her IV is burning. No other concerns voiced.    Objective: Vital signs in last 24 hours: Temp:  [97.6 F (36.4 C)-98.5 F (36.9 C)] 98.1 F (36.7 C) (05/12 0601) Pulse Rate:  [70-82] 75 (05/12 0601) Resp:  [16-21] 18 (05/12 0601) BP: (129-172)/(61-99) 140/69 (05/12 0601) SpO2:  [95 %-100 %] 95 % (05/12 0601) Last BM Date: 03/12/20  Intake/Output from previous day: 05/11 0701 - 05/12 0700 In: 3823.7 [P.O.:640; I.V.:2683.7; IV Piggyback:500] Out: 2800 [Urine:2550; Drains:150; Blood:100]  Physical Examination: General: alert, cooperative and no distress Resp: clear to auscultation bilaterally Cardio: regular rate and rhythm, S1, S2 normal, no murmur, click, rub or gallop GI: soft, non-tender; bowel sounds normal; no masses,  no organomegaly and incision: Lap sites to the abdomen with dermabond intact with no drainage present, dressings over the JPs intact and lightly stained with serosanguinous drainage Extremities: extremities normal, atraumatic, no cyanosis or edema  Foley to straight drain with clear urine, JP on the right and left charged with minimal amount of serosanguinous drainage IV site with edema above catheter insertion site. IV levaquin paused and RN notified.  Labs: WBC/Hgb/Hct/Plts:  17.6/9.1/28.2/465 (05/12 [id number]) BUN/Cr/glu/ALT/AST/amyl/lip:  9/0.79/--/--/--/--/-- (05/12 0426)  Assessment: 69 y.o. s/p Procedure(s): XI ROBOTIC ASSISTED TOTAL HYSTERECTOMY WITH BILATERAL SALPINGOOPHORECTOMY: stable Pain:  Pain is well-controlled on PRN medications.  Heme: Hgb 9.1 and Hct 28.2 this am.    ID: WBC 17.6 this am. On  levaquin and flagyl IV given tubo-ovarian abscess found in surgery on 03/12/2020. Plan to continue IV until this afternoon then transition to oral.  CV: BP and HR stable. Continue to monitor with ordered vital signs.  GI:  Tolerating po: yes. Antiemetics ordered PRN and scopolamine patch in place.  GU: Adequate output reported. Plan for removal this am. Creatinine 0.79 this am.  FEN: No critical values noted.  Prophylaxis: Lovenox ordered and SCDs on.  Plan: Plan for foley removal this am Replace IV to continue IV antibiotics until later today with possible transition to oral antibiotics Encourage ambulation, IS use, deep breathing, and coughing Continue plan of care per Dr. Denman George and Dr. Berline Lopes Plan for discharge in 1-2 days   LOS: 1 day    Catherine Lynn Catherine Lynn 03/13/2020, 8:54 AM

## 2020-03-13 NOTE — Plan of Care (Signed)
  Problem: Education: Goal: Knowledge of General Education information will improve Description: Including pain rating scale, medication(s)/side effects and non-pharmacologic comfort measures Outcome: Progressing   Problem: Elimination: Goal: Will not experience complications related to urinary retention Outcome: Progressing   Problem: Pain Managment: Goal: General experience of comfort will improve Outcome: Progressing   

## 2020-03-14 LAB — CBC
HCT: 26.8 % — ABNORMAL LOW (ref 36.0–46.0)
Hemoglobin: 8.6 g/dL — ABNORMAL LOW (ref 12.0–15.0)
MCH: 23.3 pg — ABNORMAL LOW (ref 26.0–34.0)
MCHC: 32.1 g/dL (ref 30.0–36.0)
MCV: 72.6 fL — ABNORMAL LOW (ref 80.0–100.0)
Platelets: 465 10*3/uL — ABNORMAL HIGH (ref 150–400)
RBC: 3.69 MIL/uL — ABNORMAL LOW (ref 3.87–5.11)
RDW: 19.2 % — ABNORMAL HIGH (ref 11.5–15.5)
WBC: 16.1 10*3/uL — ABNORMAL HIGH (ref 4.0–10.5)
nRBC: 0 % (ref 0.0–0.2)

## 2020-03-14 MED ORDER — AMOXICILLIN-POT CLAVULANATE 875-125 MG PO TABS: 1.0000 | ORAL_TABLET | Freq: Two times a day (BID) | ORAL | Status: DC

## 2020-03-14 MED ORDER — LEVOFLOXACIN 500 MG PO TABS: 500.0000 mg | ORAL_TABLET | Freq: Every day | ORAL | 0 refills | Status: DC

## 2020-03-14 MED ORDER — METRONIDAZOLE 500 MG PO TABS: 500.0000 mg | ORAL_TABLET | Freq: Three times a day (TID) | ORAL | 0 refills | Status: DC

## 2020-03-14 MED ORDER — AMOXICILLIN-POT CLAVULANATE 875-125 MG PO TABS: 1.0000 | ORAL_TABLET | Freq: Two times a day (BID) | ORAL | 0 refills | Status: DC

## 2020-03-14 NOTE — Progress Notes (Signed)
2 Days Post-Op Procedure(s) (LRB): XI ROBOTIC ASSISTED TOTAL HYSTERECTOMY WITH BILATERAL SALPINGOOPHORECTOMY (N/A)  Subjective: Patient reports feeling well. Minimal pain yesterday. Walked multiple times. Tolerated regular diet including chili without N/V. Denies bowel function. Urinating without difficulty. Denies VB or discharge. Denies F/C.   Objective: Vital signs in last 24 hours: Temp:  [98 F (36.7 C)-98.6 F (37 C)] 98.6 F (37 C) (05/13 0530) Pulse Rate:  [69-81] 78 (05/13 0530) Resp:  [18] 18 (05/13 0530) BP: (109-148)/(62-93) 145/62 (05/13 0530) SpO2:  [93 %-100 %] 93 % (05/13 0530) Last BM Date: 03/12/20  Intake/Output from previous day: 05/12 0701 - 05/13 0700 In: [id number] [P.O.:720; I.V.:660; IV Piggyback:400] Out: [id number] [Urine:901; Drains:160]  Physical Examination: Gen: NAD CV: RRR, no murmurs, rugs or gallops Pulm: clear to ausculation bilaterally, no wheezes or rhonchi Abd: +BS, nondistended, soft, appropriately ttp, drains with blood-tinged serous fluid, no purulent fluid Ext: no edema, warm and well perfused  Labs: WBC/Hgb/Hct/Plts:  16.1/8.6/26.8/465 (05/13 0446)    Assessment:  69 y.o. s/p Procedure(s): XI ROBOTIC ASSISTED TOTAL HYSTERECTOMY WITH BILATERAL SALPINGOOPHORECTOMY: stable, meeting post-op milestones with exception of return of bowel function. No clinical signs of infection, WBC decreasing slowly.  Pain:  Pain well controlled on oral medications.  Heme: Acute on chronic anemia secondary to surgical blood loss and hemodilution. No signs or symptoms of anemia.   ID: WBC elevated, likely in setting of infected ovary and steroids for surgery. Slowly decreasing. Will transition from IV abx to PO with total of 14 days of levo/flagyl. Cultures shows small number gram negative rods, final results still pending.  GI/FEN:  Tolerating po: Yes. Patient on bowel regimen, still awaiting bowel function. Hep lock.  Prophylaxis: continue ppx  lovenox.  Plan: Encourage ambulation Discontinue IV fluids  Transition from IV to PO abx Dispo:  Likely later today, will dc drains prior to discharge The patient is to be discharged to home.   LOS: 2 days    [name] 03/14/2020, 7:16 AM

## 2020-03-14 NOTE — Anesthesia Postprocedure Evaluation (Signed)
Anesthesia Post Note  Patient: Catherine Lynn  Procedure(s) Performed: XI ROBOTIC ASSISTED TOTAL HYSTERECTOMY WITH BILATERAL SALPINGOOPHORECTOMY (N/A )     Patient location during evaluation: PACU Anesthesia Type: General Level of consciousness: awake Pain management: pain level controlled Vital Signs Assessment: post-procedure vital signs reviewed and stable Respiratory status: spontaneous breathing Cardiovascular status: stable Postop Assessment: no apparent nausea or vomiting Anesthetic complications: no    Last Vitals:  Vitals:   03/14/20 0530 03/14/20 1338  BP: (!) 145/62 (!) 127/99  Pulse: 78 81  Resp: 18 19  Temp: 37 C (!) 36.3 C  SpO2: 93% 96%    Last Pain:  Vitals:   03/14/20 1105  TempSrc:   PainSc: 0-No pain   Pain Goal: Patients Stated Pain Goal: 2 (03/13/20 0800)                 Huston Foley

## 2020-03-14 NOTE — Plan of Care (Signed)
  Problem: Elimination: Goal: Will not experience complications related to bowel motility Outcome: Adequate for Discharge   

## 2020-03-14 NOTE — Discharge Instructions (Addendum)
03/14/2020  Return to work: 4-6 weeks if applicable  You are to begin taking the antibiotic Augmentin twice a day for a total of 12 days.  Activity: 1. Be up and out of the bed during the day.  Take a nap if needed.  You may walk up steps but be careful and use the hand rail.  Stair climbing will tire you more than you think, you may need to stop part way and rest.   2. No lifting or straining for 6 weeks.  3. No driving for around 1 week(s).  Do not drive if you are taking narcotic pain medicine. You need to make sure your reaction time has returned.  4. Shower daily.  Use soap and water on your incision and pat dry; don't rub.  No tub baths until cleared by your surgeon.   5. No sexual activity and nothing in the vagina for 8 weeks.  6. You may experience a small amount of clear drainage from your incisions, which is normal.  If the drainage persists or increases, please call the office.  7. You may experience vaginal spotting after surgery or around the 6-8 week mark from surgery when the stitches at the top of the vagina begin to dissolve.  The spotting is normal but if you experience heavy bleeding, call our office.  8. Take Tylenol or ibuprofen first for pain and only use narcotic pain medication for severe pain not relieved by the Tylenol or Ibuprofen.  Monitor your Tylenol intake to a max of 4,000 mg.  Diet: 1. Low sodium Heart Healthy Diet is recommended.  2. It is safe to use a laxative, such as Miralax or Colace, if you have difficulty moving your bowels. You can take Sennakot at bedtime every evening to keep bowel movements regular and to prevent constipation.    Wound Care: 1. Keep clean and dry.  Shower daily.  2. You can put bandaids over the drain sites until they have scabbed over then no dressing is needed.  Reasons to call the Doctor:  Fever - Oral temperature greater than 100.4 degrees Fahrenheit  Foul-smelling vaginal discharge  Difficulty  urinating  Nausea and vomiting  Increased pain at the site of the incision that is unrelieved with pain medicine.  Difficulty breathing with or without chest pain  New calf pain especially if only on one side  Sudden, continuing increased vaginal bleeding with or without clots.   Contacts: For questions or concerns you should contact:  Dr. Jeral Pinch at [phone]  Joylene John, NP at [phone]  After Hours: call [phone] and have the GYN Oncologist paged/contacted  Amoxicillin; Clavulanic Acid Tablets What is this medicine? AMOXICILLIN; CLAVULANIC ACID (a mox i SIL in; KLAV yoo lan ic AS id) is a penicillin antibiotic. It treats some infections caused by bacteria. It will not work for colds, the flu, or other viruses. This medicine may be used for other purposes; ask your health care provider or pharmacist if you have questions. COMMON BRAND NAME(S): Augmentin What should I tell my health care provider before I take this medicine? They need to know if you have any of these conditions:  bowel disease, like colitis  kidney disease  liver disease  mononucleosis  an unusual or allergic reaction to amoxicillin, penicillin, cephalosporin, other antibiotics, clavulanic acid, other medicines, foods, dyes, or preservatives  pregnant or trying to get pregnant  breast-feeding How should I use this medicine? Take this drug by mouth. Take it as directed on  the prescription label at the same time every day. Take it with food at the start of a meal or snack. Take all of this drug unless your health care provider tells you to stop it early. Keep taking it even if you think you are better. Talk to your health care provider about the use of this drug in children. While it may be prescribed for selected conditions, precautions do apply. Overdosage: If you think you have taken too much of this medicine contact a poison control center or emergency room at once. NOTE: This  medicine is only for you. Do not share this medicine with others. What if I miss a dose? If you miss a dose, take it as soon as you can. If it is almost time for your next dose, take only that dose. Do not take double or extra doses. What may interact with this medicine?  allopurinol  anticoagulants  birth control pills  methotrexate  probenecid This list may not describe all possible interactions. Give your health care provider a list of all the medicines, herbs, non-prescription drugs, or dietary supplements you use. Also tell them if you smoke, drink alcohol, or use illegal drugs. Some items may interact with your medicine. What should I watch for while using this medicine? Tell your doctor or healthcare provider if your symptoms do not improve. This medicine may cause serious skin reactions. They can happen weeks to months after starting the medicine. Contact your healthcare provider right away if you notice fevers or flu-like symptoms with a rash. The rash may be red or purple and then turn into blisters or peeling of the skin. Or, you might notice a red rash with swelling of the face, lips or lymph nodes in your neck or under your arms. Do not treat diarrhea with over the counter products. Contact your doctor if you have diarrhea that lasts more than 2 days or if it is severe and watery. If you have diabetes, you may get a false-positive result for sugar in your urine. Check with your doctor or healthcare provider. Birth control pills may not work properly while you are taking this medicine. Talk to your doctor about using an extra method of birth control. What side effects may I notice from receiving this medicine? Side effects that you should report to your doctor or health care professional as soon as possible:  allergic reactions like skin rash, itching or hives, swelling of the face, lips, or tongue  breathing problems  dark urine  fever or chills, sore throat  redness,  blistering, peeling, or loosening of the skin, including inside the mouth  seizures  trouble passing urine or change in the amount of urine  unusual bleeding, bruising  unusually weak or tired  white patches or sores in the mouth or throat Side effects that usually do not require medical attention (report to your doctor or health care professional if they continue or are bothersome):  diarrhea  dizziness  headache  nausea, vomiting  stomach upset  vaginal or anal irritation This list may not describe all possible side effects. Call your doctor for medical advice about side effects. You may report side effects to FDA at 1-800-FDA-1088. Where should I keep my medicine? Keep out of the reach of children and pets. Store at room temperature between 20 and 25 degrees C (68 and 77 degrees F). Throw away any unused drug after the expiration date. NOTE: This sheet is a summary. It may not cover all possible  information. If you have questions about this medicine, talk to your doctor, pharmacist, or health care provider.  2020 [employer]vier/Gold Standard (2019-05-22 11:55:53)

## 2020-03-14 NOTE — Progress Notes (Signed)
Discharge instructions were given to the patient and family.  All questions were answered.  The patient was taken to the main entrance in a wheelchair.

## 2020-03-14 NOTE — Discharge Summary (Signed)
Physician Discharge Summary  Patient ID: Catherine Lynn MRN: [id number] DOB/AGE: 03-18-1951 69 y.o.  Admit date: 03/12/2020 Discharge date: 03/14/2020  Admission Diagnoses: Tubo-ovarian abscess  Discharge Diagnoses:  Principal Problem:   Tubo-ovarian abscess Active Problems:   Pelvic mass in female   Discharged Condition: good  Hospital Course:  1/ patient was admitted on postoperative care as well as IV antibiotics in the setting of a tubo-ovarian abscess status post robotic surgery including total laparoscopic hysterectomy and bilateral salpingo-oophorectomy 2/ surgery was uncomplicated  3/ on postoperative day 2 the patient was meeting discharge criteria: tolerating PO, voiding urine, ambulating, pain well controlled on oral medications.  She had a bowel movement prior to discharge. 4/ new medications on discharge include Augmentin for 12 days.  Patient was continued on levofloxacin and Flagyl for 2 days of her hospital stay and given culture and sensitivities will be discharged home on Augmentin.  Consults: None  Significant Diagnostic Studies: labs:  CBC    Component Value Date/Time   WBC 16.1 (H) 03/14/2020 0446   RBC 3.69 (L) 03/14/2020 0446   HGB 8.6 (L) 03/14/2020 0446   HCT 26.8 (L) 03/14/2020 0446   PLT 465 (H) 03/14/2020 0446   MCV 72.6 (L) 03/14/2020 0446   MCH 23.3 (L) 03/14/2020 0446   MCHC 32.1 03/14/2020 0446   RDW 19.2 (H) 03/14/2020 0446   LYMPHSABS 4.4 (H) 01/24/2020 1645   MONOABS 1.7 (H) 01/24/2020 1645   EOSABS 1.3 (H) 01/24/2020 1645   BASOSABS 0.1 01/24/2020 1645   CMP Latest Ref Rng & Units 03/13/2020 03/05/2020 01/24/2020  Glucose 70 - 99 mg/dL 153(H) 123(H) 123(H)  BUN 8 - 23 mg/dL 9 9 10   Creatinine 0.44 - 1.00 mg/dL 0.79 0.96 0.96  Sodium 135 - 145 mmol/L 135 137 133(L)  Potassium 3.5 - 5.1 mmol/L 4.6 3.8 3.8  Chloride 98 - 111 mmol/L 103 104 102  CO2 22 - 32 mmol/L 24 24 24   Calcium 8.9 - 10.3 mg/dL 8.6(L) 9.4 9.1  Total Protein 6.5 - 8.1  g/dL - 8.1 7.5  Total Bilirubin 0.3 - 1.2 mg/dL - 0.4 0.2(L)  Alkaline Phos 38 - 126 U/L - 69 71  AST 15 - 41 U/L - 11(L) 14(L)  ALT 0 - 44 U/L - 10 12    Treatments: IV hydration, antibiotics IV  Discharge Exam: Blood pressure (!) 127/99, pulse 81, temperature (!) 97.3 F (36.3 C), resp. rate 19, height 5\' 3"  (1.6 m), weight 200 lb (90.7 kg), SpO2 96 %. General appearance: alert and cooperative Resp: clear to auscultation bilaterally Cardio: regular rate and rhythm, S1, S2 normal, no murmur, click, rub or gallop GI: soft, non-tender; bowel sounds normal; no masses,  no organomegaly  JP drains removed with minimal serous fluid noted.  Dermabond was used to close to incisions after drain removal.  Disposition: Discharge disposition: 01-Home or Self Care       Discharge Instructions    Call MD for:  difficulty breathing, headache or visual disturbances   Complete by: As directed    Call MD for:  extreme fatigue   Complete by: As directed    Call MD for:  hives   Complete by: As directed    Call MD for:  persistant dizziness or light-headedness   Complete by: As directed    Call MD for:  persistant nausea and vomiting   Complete by: As directed    Call MD for:  redness, tenderness, or signs of infection (pain, swelling, redness,  odor or green/yellow discharge around incision site)   Complete by: As directed    Call MD for:  severe uncontrolled pain   Complete by: As directed    Call MD for:  temperature >100.4   Complete by: As directed    Diet - low sodium heart healthy   Complete by: As directed    Discharge   Complete by: As directed    Driving Restrictions   Complete by: As directed    No driving for around 1 week.  Do not take narcotics and drive. You need to make sure your reaction time has returned to normal.   Increase activity slowly   Complete by: As directed    Lifting restrictions   Complete by: As directed    No lifting greater than 10 lbs for 6 weeks.    Sexual Activity Restrictions   Complete by: As directed    No sexual activity, nothing in the vagina, for 8 weeks.     Allergies as of 03/14/2020   No Known Allergies     Medication List    STOP taking these medications   erythromycin base 500 MG tablet Commonly known as: E-MYCIN   neomycin 500 MG tablet Commonly known as: MYCIFRADIN     TAKE these medications   amoxicillin-clavulanate 875-125 MG tablet Commonly known as: AUGMENTIN Take 1 tablet by mouth 2 (two) times daily.   atorvastatin 20 MG tablet Commonly known as: LIPITOR Take 20 mg by mouth daily.   calcium carbonate 1500 (600 Ca) MG Tabs tablet Commonly known as: OSCAL Take 600 mg of elemental calcium by mouth daily with breakfast.   ibandronate 150 MG tablet Commonly known as: BONIVA Take 150 mg by mouth every 30 (thirty) days.   ibuprofen 600 MG tablet Commonly known as: ADVIL Take 1 tablet (600 mg total) by mouth every 6 (six) hours as needed for moderate pain. For AFTER surgery   multivitamin with minerals Tabs tablet Take 1 tablet by mouth daily.   senna-docusate 8.6-50 MG tablet Commonly known as: Senokot-S Take 2 tablets by mouth at bedtime. For AFTER surgery, do not take if having diarrhea   traMADol 50 MG tablet Commonly known as: ULTRAM Take 1 tablet (50 mg total) by mouth every 6 (six) hours as needed for severe pain. For AFTER surgery, do not take and drive      Follow-up Information    [name], MD Follow up on 04/05/2020.   Specialty: Gynecologic Oncology Why: at 11:45am at the [facility]. Contact information: [place] [address] [phone]           Signed: [name] 03/14/2020, 5:22 PM

## 2020-03-15 ENCOUNTER — Telehealth: Payer: Self-pay

## 2020-03-15 LAB — AEROBIC/ANAEROBIC CULTURE W GRAM STAIN (SURGICAL/DEEP WOUND): Gram Stain: NONE SEEN

## 2020-03-15 NOTE — Telephone Encounter (Signed)
Catherine Lynn states that she is eating,drinking, and urinating well. She has had 1bowel movements since coming home yesterday. Afebrile. Incisions are D&I. An incision on the left abdomen does not have the glue on it. It was stuck to her hospital gown and came off with her gown.  Told her to put a Band-Aid on the area.  The 2 sites where the drains were in ar sealed with derma bond. Pain controled with tylenol and Ibuprofen. Afebrile. Pt aware of in office  post op appointments for 5-18 and 04-05-20. Pt knows to call the office [phone] if she hsd any questions or concerns.

## 2020-03-15 NOTE — Telephone Encounter (Signed)
Ms Catherine Lynn called stating that she forgot to tell the nurse this am that she began feeling SOB with exertion. She is not SOB at rest. She denies any chest pain with SOB. Ms Catherine Lynn states that she is not experiencing any pain in legs with weight bearing or swelling in her legs. Reviewed symptoms with Dr. Berline Lopes. Told  Ms Catherine Lynn that Dr. Berline Lopes said to monitor the SOB.  If her symptoms get worse, she needs to go to [facility] ED to be evaluated. Pt verbalized understanding.

## 2020-03-15 NOTE — Telephone Encounter (Signed)
Catherine Lynn Can you check on [place] today? She had her drains pulled yesterday so you can review care of those incisions (bandaid) and she should be taking Augmentin. Can you tell her that we will plan to bring her in on Wed in person instead of phone visit?

## 2020-03-15 NOTE — Telephone Encounter (Signed)
Ms Amonett wanted to review post op breathing exercises that were noted on post op sheets. Instructed her on taking a deep breath and coughing when letting the air ou. Pt did the exercise while on the phone with this nurse. Pt states that currently her SOB with exertion has decreased. Told her that the breathing exercises should help as well.

## 2020-03-15 NOTE — Progress Notes (Signed)
Gynecologic Oncology Return Clinic Visit  5/18  Reason for Visit: Postoperative check  Treatment History: Seen in mid March with abdominal pain.  CT at that time showed pelvic mass measuring up to 9 cm in the right adnexa with concurrent leukocytosis.  Patient was seen in the emergency department on 3/24 and treated for presumed infection. 5/11: Robotic assisted total laparoscopic hysterectomy with bilateral salpingo-oophorectomy, right ureterolysis, and lysis of adhesions.  Findings at the time of surgery were notable for a right tubo-ovarian abscess with significant adhesive disease, Fitz-Hugh Curtis.  Patient was admitted postoperatively to receive 48 hours of IV antibiotics and transition to oral antibiotics and discharged on 5/13.  Culture from surgery showed few E. coli, Bacteroides fragilis.  Patient was ultimately discharged home to complete a course of Augmentin.  Her white blood cell count on the morning of discharge was 16.1.  Interval History: The patient reports doing very well.  She has continued on her antibiotics without any issues.  She denies any fevers or chills.  She endorses having a good appetite without nausea or emesis.  She reports regular daily bowel function and denies any urinary symptoms.  She denies any vaginal bleeding or discharge.  Past Medical/Surgical History: Past Medical History:  Diagnosis Date  . High cholesterol   . Obesity (BMI 30-39.9)   . Pelvic mass     Past Surgical History:  Procedure Laterality Date  . COLONOSCOPY    . ROBOTIC ASSISTED SALPINGO OOPHERECTOMY N/A 03/12/2020   Procedure: XI ROBOTIC ASSISTED TOTAL HYSTERECTOMY WITH BILATERAL SALPINGOOPHORECTOMY;  Surgeon: [name], MD;  Location: [facility];  Service: Gynecology;  Laterality: N/A;  . TUBAL LIGATION      Family History  Problem Relation Age of Onset  . Breast cancer Sister   . Diabetes Sister   . Cancer Maternal Grandfather        not sure  . Cancer Niece        not  sure    Social History   Socioeconomic History  . Marital status: Single    Spouse name: Not on file  . Number of children: Not on file  . Years of education: Not on file  . Highest education level: Not on file  Occupational History  . Not on file  Tobacco Use  . Smoking status: Former Smoker    Types: Cigarettes    Quit date: 03/05/2018    Years since quitting: 2.0  . Smokeless tobacco: Never Used  . Tobacco comment: QUit about 2 years ago  Substance and Sexual Activity  . Alcohol use: Not Currently  . Drug use: Not Currently    Types: "Crack" cocaine, Marijuana    Comment: over 20 years, Marijuana once per monthly currently  . Sexual activity: Not Currently    Birth control/protection: Post-menopausal  Other Topics Concern  . Not on file  Social History Narrative  . Not on file   Social Determinants of Health   Financial Resource Strain:   . Difficulty of Paying Living Expenses:   Food Insecurity:   . Worried About Charity fundraiser in the Last Year:   . Arboriculturist in the Last Year:   Transportation Needs:   . Film/video editor (Medical):   Marland Kitchen Lack of Transportation (Non-Medical):   Physical Activity:   . Days of Exercise per Week:   . Minutes of Exercise per Session:   Stress:   . Feeling of Stress :   Social Connections:   .  Frequency of Communication with Friends and Family:   . Frequency of Social Gatherings with Friends and Family:   . Attends Religious Services:   . Active Member of Clubs or Organizations:   . Attends Archivist Meetings:   Marland Kitchen Marital Status:     Current Medications:  Current Outpatient Medications:  .  amoxicillin-clavulanate (AUGMENTIN) 875-125 MG tablet, Take 1 tablet by mouth 2 (two) times daily., Disp: 24 tablet, Rfl: 0 .  atorvastatin (LIPITOR) 20 MG tablet, Take 20 mg by mouth daily., Disp: , Rfl:  .  calcium carbonate (OSCAL) 1500 (600 Ca) MG TABS tablet, Take 600 mg of elemental calcium by mouth daily  with breakfast., Disp: , Rfl:  .  ibandronate (BONIVA) 150 MG tablet, Take 150 mg by mouth every 30 (thirty) days., Disp: , Rfl:  .  ibuprofen (ADVIL) 600 MG tablet, Take 1 tablet (600 mg total) by mouth every 6 (six) hours as needed for moderate pain. For AFTER surgery, Disp: 15 tablet, Rfl: 0 .  Multiple Vitamin (MULTIVITAMIN WITH MINERALS) TABS tablet, Take 1 tablet by mouth daily., Disp: , Rfl:  .  senna-docusate (SENOKOT-S) 8.6-50 MG tablet, Take 2 tablets by mouth at bedtime. For AFTER surgery, do not take if having diarrhea, Disp: 30 tablet, Rfl: 0 .  traMADol (ULTRAM) 50 MG tablet, Take 1 tablet (50 mg total) by mouth every 6 (six) hours as needed for severe pain. For AFTER surgery, do not take and drive, Disp: 10 tablet, Rfl: 0  Review of Systems: Denies appetite changes, fevers, chills, fatigue, unexplained weight changes. Denies hearing loss, neck lumps or masses, mouth sores, ringing in ears or voice changes. Denies cough or wheezing.  Denies shortness of breath. Denies chest pain or palpitations. Denies leg swelling. Denies abdominal distention, pain, blood in stools, constipation, diarrhea, nausea, vomiting, or early satiety. Denies pain with intercourse, dysuria, frequency, hematuria or incontinence. Denies hot flashes, pelvic pain, vaginal bleeding or vaginal discharge.   Denies joint pain, back pain or muscle pain/cramps. Denies itching, rash, or wounds. Denies dizziness, headaches, numbness or seizures. Denies swollen lymph nodes or glands, denies easy bruising or bleeding. Denies anxiety, depression, confusion, or decreased concentration.  Physical Exam: BP (!) 156/61 (BP Location: Left Arm, Patient Position: Sitting)   Pulse 72   Temp 98.7 F (37.1 C) (Oral)   Resp 16   Ht 5\' 3"  (1.6 m)   Wt 201 lb 6 oz (91.3 kg)   SpO2 100%   BMI 35.67 kg/m  General: Alert, oriented, no acute distress. HEENT: Cephalic, atraumatic, sclera anicteric. Chest: Unlabored breathing on  room air. Abdomen: Obese, soft, nontender.  Normoactive bowel sounds.  No masses or hepatosplenomegaly appreciated.  Well-healing laparoscopic incisions, most still with Dermabond in place. Extremities: Grossly normal range of motion.  Warm, well perfused.  No edema bilaterally.  Laboratory & Radiologic Studies: None new  Assessment & Plan: Catherine Lynn is a 69 y.o. woman with a tubo-ovarian abscess now 1 week status post definitive surgery.  Reviewed final pathology which shows no dysplasia or malignancy.  Patient happy with this news.  She overall is healing well from surgery without any significant issues.  Discussed importance of continuing her course of antibiotics as well as activity restrictions.  I will see her back for a pelvic exam in early June.  She knows to call the clinic if she develops any systemic signs or symptoms concerning for an infection.  20 minutes of total time was spent for this patient  encounter, including preparation, face-to-face counseling with the patient and coordination of care, and documentation of the encounter.  Jeral Pinch, MD  Division of Gynecologic Oncology  Department of Obstetrics and Gynecology  [facility]

## 2020-03-19 ENCOUNTER — Inpatient Hospital Stay: Payer: Medicare Other | Attending: Gynecologic Oncology | Admitting: Gynecologic Oncology

## 2020-03-19 ENCOUNTER — Other Ambulatory Visit: Payer: Self-pay

## 2020-03-19 ENCOUNTER — Encounter: Payer: Self-pay | Admitting: Gynecologic Oncology

## 2020-03-19 VITALS — BP 156/61 | HR 72 | Temp 98.7°F | Resp 16 | Ht 63.0 in | Wt 201.4 lb

## 2020-03-19 DIAGNOSIS — Z90722 Acquired absence of ovaries, bilateral: Secondary | ICD-10-CM | POA: Insufficient documentation

## 2020-03-19 DIAGNOSIS — K660 Peritoneal adhesions (postprocedural) (postinfection): Secondary | ICD-10-CM | POA: Insufficient documentation

## 2020-03-19 DIAGNOSIS — R1900 Intra-abdominal and pelvic swelling, mass and lump, unspecified site: Secondary | ICD-10-CM

## 2020-03-19 DIAGNOSIS — Z90710 Acquired absence of both cervix and uterus: Secondary | ICD-10-CM | POA: Insufficient documentation

## 2020-03-19 DIAGNOSIS — N7093 Salpingitis and oophoritis, unspecified: Secondary | ICD-10-CM | POA: Insufficient documentation

## 2020-03-19 NOTE — Patient Instructions (Signed)
You look great today.  If you have any issues that come up before your next visit, please call [phone] to be seen sooner.  Otherwise I will see you in about 2 weeks and will do a pelvic exam at that time.

## 2020-03-29 ENCOUNTER — Ambulatory Visit: Payer: Medicare Other | Admitting: Gynecologic Oncology

## 2020-04-02 ENCOUNTER — Ambulatory Visit: Payer: Medicare Other | Admitting: Gynecologic Oncology

## 2020-04-03 NOTE — Progress Notes (Signed)
Gynecologic Oncology Return Clinic Visit  6/4  Reason for Visit: Postoperative follow-up  Treatment History: Seen in mid March with abdominal pain.  CT at that time showed pelvic mass measuring up to 9 cm in the right adnexa with concurrent leukocytosis.  Patient was seen in the emergency department on 3/24 and treated for presumed infection. 5/11: Robotic assisted total laparoscopic hysterectomy with bilateral salpingo-oophorectomy, right ureterolysis, and lysis of adhesions.  Findings at the time of surgery were notable for a right tubo-ovarian abscess with significant adhesive disease, Fitz-Hugh Curtis.  Patient was admitted postoperatively to receive 48 hours of IV antibiotics and transition to oral antibiotics and discharged on 5/13.  Culture from surgery showed few E. coli, Bacteroides fragilis.  Patient was ultimately discharged home to complete a course of Augmentin.  Her white blood cell count on the morning of discharge was 16.1.  Interval History: Patient overall reports doing very well.  She denies any fevers or chills.  She was having some loose stools with antibiotics but over the last week they have become more formed.  She denies any issues with constipation.  She reports voiding freely.  She has had several episodes of very minimal vaginal spotting, otherwise denies vaginal bleeding or discharge.  Denies any significant abdominal or pelvic pain.  Past Medical/Surgical History: Past Medical History:  Diagnosis Date   High cholesterol    Obesity (BMI 30-39.9)    Pelvic mass     Past Surgical History:  Procedure Laterality Date   COLONOSCOPY     ROBOTIC ASSISTED SALPINGO OOPHERECTOMY N/A 03/12/2020   Procedure: XI ROBOTIC ASSISTED TOTAL HYSTERECTOMY WITH BILATERAL SALPINGOOPHORECTOMY;  Surgeon: [name], MD;  Location: [facility];  Service: Gynecology;  Laterality: N/A;   TUBAL LIGATION      Family History  Problem Relation Age of Onset   Breast cancer Sister     Diabetes Sister    Cancer Maternal Grandfather        not sure   Cancer Niece        not sure    Social History   Socioeconomic History   Marital status: Single    Spouse name: Not on file   Number of children: Not on file   Years of education: Not on file   Highest education level: Not on file  Occupational History   Not on file  Tobacco Use   Smoking status: Former Smoker    Types: Cigarettes    Quit date: 03/05/2018    Years since quitting: 2.0   Smokeless tobacco: Never Used   Tobacco comment: QUit about 2 years ago  Substance and Sexual Activity   Alcohol use: Not Currently   Drug use: Not Currently    Types: "Crack" cocaine, Marijuana    Comment: over 20 years, Marijuana once per monthly currently   Sexual activity: Not Currently    Birth control/protection: Post-menopausal  Other Topics Concern   Not on file  Social History Narrative   Not on file   Social Determinants of Health   Financial Resource Strain:    Difficulty of Paying Living Expenses:   Food Insecurity:    Worried About Charity fundraiser in the Last Year:    Arboriculturist in the Last Year:   Transportation Needs:    Film/video editor (Medical):    Lack of Transportation (Non-Medical):   Physical Activity:    Days of Exercise per Week:    Minutes of Exercise per Session:  Stress:    Feeling of Stress :   Social Connections:    Frequency of Communication with Friends and Family:    Frequency of Social Gatherings with Friends and Family:    Attends Religious Services:    Active Member of Clubs or Organizations:    Attends Archivist Meetings:    Marital Status:     Current Medications:  Current Outpatient Medications:    atorvastatin (LIPITOR) 20 MG tablet, Take 20 mg by mouth daily., Disp: , Rfl:    calcium carbonate (OSCAL) 1500 (600 Ca) MG TABS tablet, Take 600 mg of elemental calcium by mouth daily with breakfast., Disp: , Rfl:     Multiple Vitamin (MULTIVITAMIN WITH MINERALS) TABS tablet, Take 1 tablet by mouth daily., Disp: , Rfl:   Review of Systems: Denies appetite changes, fevers, chills, fatigue, unexplained weight changes. Denies hearing loss, neck lumps or masses, mouth sores, ringing in ears or voice changes. Denies cough or wheezing.  Denies shortness of breath. Denies chest pain or palpitations. Denies leg swelling. Denies abdominal distention, pain, blood in stools, constipation, diarrhea, nausea, vomiting, or early satiety. Denies pain with intercourse, dysuria, frequency, hematuria or incontinence. Denies hot flashes, pelvic pain, vaginal bleeding or vaginal discharge.   Denies joint pain, back pain or muscle pain/cramps. Denies itching, rash, or wounds. Denies dizziness, headaches, numbness or seizures. Denies swollen lymph nodes or glands, denies easy bruising or bleeding. Denies anxiety, depression, confusion, or decreased concentration.  Physical Exam: BP (!) 144/77 (BP Location: Left Arm, Patient Position: Sitting)    Pulse 71    Temp 98.6 F (37 C) (Temporal)    Resp 18    Ht 5\' 3"  (1.6 m)    Wt 202 lb 9.6 oz (91.9 kg)    SpO2 100%    BMI 35.89 kg/m  General: Alert, oriented, no acute distress. HEENT: Posterior oropharynx clear, sclera anicteric. Chest: Unlabored breathing on room air. Abdomen: Obese, soft, nontender.  Normoactive bowel sounds.  No masses or hepatosplenomegaly appreciated.  Well-healed incisions. Extremities: Grossly normal range of motion.  Warm, well perfused.  No edema bilaterally. Skin: No rashes or lesions noted. GU: Normal appearing external genitalia without erythema, excoriation, or lesions.  Speculum exam reveals cuff intact, suture still visible.  No discharge or bleeding.  Bimanual exam reveals cuff intact, no fluctuance or tenderness.    Laboratory & Radiologic Studies: None new  Assessment & Plan: Catherine Lynn is a 69 y.o. woman with a tubo-ovarian abscess  now 3+ weeks status post definitive surgery.  Patient is overall doing very well, now off antibiotics for her treatment of TOA after definitive surgery.  We discussed continued activity restrictions and nothing in the vagina for 8 weeks after surgery.  She knows to call the office if she develops any systemic symptoms of infection or vaginal bleeding/discharge.  Otherwise, I have recommended that she follow-up with her gynecologist for any future GYN needs.  15 minutes of total time was spent for this patient encounter, including preparation, face-to-face counseling with the patient and coordination of care, and documentation of the encounter.  Jeral Pinch, MD  Division of Gynecologic Oncology  Department of Obstetrics and Gynecology  [facility]

## 2020-04-05 ENCOUNTER — Encounter: Payer: Self-pay | Admitting: Gynecologic Oncology

## 2020-04-05 ENCOUNTER — Other Ambulatory Visit: Payer: Self-pay

## 2020-04-05 ENCOUNTER — Inpatient Hospital Stay: Payer: Medicare Other | Attending: Gynecologic Oncology | Admitting: Gynecologic Oncology

## 2020-04-05 VITALS — BP 144/77 | HR 71 | Temp 98.6°F | Resp 18 | Ht 63.0 in | Wt 202.6 lb

## 2020-04-05 DIAGNOSIS — Z90722 Acquired absence of ovaries, bilateral: Secondary | ICD-10-CM | POA: Insufficient documentation

## 2020-04-05 DIAGNOSIS — R1900 Intra-abdominal and pelvic swelling, mass and lump, unspecified site: Secondary | ICD-10-CM

## 2020-04-05 DIAGNOSIS — Z90710 Acquired absence of both cervix and uterus: Secondary | ICD-10-CM | POA: Insufficient documentation

## 2020-04-05 DIAGNOSIS — N7093 Salpingitis and oophoritis, unspecified: Secondary | ICD-10-CM | POA: Insufficient documentation

## 2020-04-05 NOTE — Patient Instructions (Addendum)
Continue activity restrictions of no lifting or nothing in the vagina for 8 weeks after surgery.  No tub baths or submerging your body in water like a swimming pool until you are 8 weeks from surgery. Call our office for any symptoms such as increasing vaginal discharge, fever, etc at [phone]. You can follow up with your regular GYN if no needs.

## 2020-12-10 ENCOUNTER — Telehealth: Payer: Self-pay | Admitting: *Deleted

## 2020-12-10 ENCOUNTER — Emergency Department (HOSPITAL_COMMUNITY)
Admission: EM | Admit: 2020-12-10 | Discharge: 2020-12-10 | Disposition: A | Discharge status: Home / Self Care | Payer: Medicare Other | Attending: Emergency Medicine | Admitting: Emergency Medicine

## 2020-12-10 ENCOUNTER — Encounter (HOSPITAL_COMMUNITY): Payer: Self-pay | Admitting: Emergency Medicine

## 2020-12-10 DIAGNOSIS — Z87891 Personal history of nicotine dependence: Secondary | ICD-10-CM | POA: Diagnosis not present

## 2020-12-10 DIAGNOSIS — K1379 Other lesions of oral mucosa: Secondary | ICD-10-CM | POA: Diagnosis present

## 2020-12-10 DIAGNOSIS — B370 Candidal stomatitis: Secondary | ICD-10-CM | POA: Diagnosis not present

## 2020-12-10 MED ORDER — NYSTATIN 100000 UNIT/ML MT SUSP
500000.0000 [IU] | Freq: Four times a day (QID) | OROMUCOSAL | 0 refills | Status: AC
Start: 2020-12-10 — End: 2020-12-20

## 2020-12-10 MED ORDER — IBUPROFEN 400 MG PO TABS
600.0000 mg | ORAL_TABLET | Freq: Once | ORAL | Status: AC
  Administered 2020-12-10: 600 mg via ORAL
  Filled 2020-12-10: qty 1

## 2020-12-10 NOTE — Telephone Encounter (Signed)
Pharmacy called related to Rx: nystatin quantity .Marland KitchenMarland KitchenEDCM clarified with EDP (Sponseller) to change Rx to: 270ml.

## 2020-12-10 NOTE — ED Notes (Signed)
Patient verbalizes understanding of discharge instructions. Opportunity for questioning and answers were provided. Armband removed by staff, pt discharged from ED.  

## 2020-12-10 NOTE — ED Triage Notes (Signed)
Pt reports for the past 3 days pain and uncomfortable sensation inside of her mouth. Pt appears to have layer of white covering on tongue.

## 2020-12-10 NOTE — ED Provider Notes (Signed)
[facility]ERG[facility] DEPARTMENT Provider Note   CSN: [id number] Arrival date & time: 12/10/20  0300     History Chief Complaint  Patient presents with  . Oral Pain    Catherine Lynn is a 70 y.o. female who presents today with concern for 3 days of worsening mouth pain and dry mouth.  She states that she has not wanted to eat or drink for the last 2 days due to her mouth pain and changes in her ability to taste food.  She states that has been hurting too severely for her to take medicines at home today, however prior to that she was taking Tylenol with minimal relief.  She denies any nausea, vomiting, diarrhea.  Denies congestion, sore throat, difficulty swallowing, change in her voice, pain in her neck. Denies known sick contacts. States she has had issues with her mouth since having all of her teeth removed approximately 2 years ago. She does not have dentures. She has a PCP, and is scheduled to see a gum specialist tomorrow morning at 8:30.  Patient has not used antibiotics in the last 3 months, does not use an inhaler every day, has not been on steroids, is not diagnosed with any immunocompromising diseases such as diabetes or HIV, and does not have known diagnosis of cancer.  Patient had pelvic mass removed approximately 6 months ago, which was benign based on surgical pathology.   I personally read her medical records.  History of hypercholesterolemia and obesity, is only on atorvastatin daily.   HPI     Past Medical History:  Diagnosis Date  . High cholesterol   . Obesity (BMI 30-39.9)   . Pelvic mass     There are no problems to display for this patient.   Past Surgical History:  Procedure Laterality Date  . COLONOSCOPY    . ROBOTIC ASSISTED SALPINGO OOPHERECTOMY N/A 03/12/2020   Procedure: XI ROBOTIC ASSISTED TOTAL HYSTERECTOMY WITH BILATERAL SALPINGOOPHORECTOMY;  Surgeon: [name], MD;  Location: [facility];  Service: Gynecology;  Laterality:  N/A;  . TUBAL LIGATION       OB History    Gravida  2   Para  2   Term      Preterm      AB      Living  2     SAB      IAB      Ectopic      Multiple      Live Births  2           Family History  Problem Relation Age of Onset  . Breast cancer Sister   . Diabetes Sister   . Cancer Maternal Grandfather        not sure  . Cancer Niece        not sure    Social History   Tobacco Use  . Smoking status: Former Smoker    Types: Cigarettes    Quit date: 03/05/2018    Years since quitting: 2.7  . Smokeless tobacco: Never Used  . Tobacco comment: QUit about 2 years ago  Vaping Use  . Vaping Use: Former  Substance Use Topics  . Alcohol use: Not Currently  . Drug use: Not Currently    Types: "Crack" cocaine, Marijuana    Comment: over 20 years, Marijuana once per monthly currently    Home Medications Prior to Admission medications   Medication Sig Start Date End Date Taking? Authorizing Provider  nystatin (  MYCOSTATIN) 100000 UNIT/ML suspension Use as directed 5 mLs (500,000 Units total) in the mouth or throat 4 (four) times daily for 10 days. Swish in the mouth and retain for as long as possible (several minutes) before swallowing. 12/10/20 12/20/20 Yes Claus Silvestro, Eugene Garnet R, PA-C  atorvastatin (LIPITOR) 20 MG tablet Take 20 mg by mouth daily.    [provider]  calcium carbonate (OSCAL) 1500 (600 Ca) MG TABS tablet Take 600 mg of elemental calcium by mouth daily with breakfast.    [provider]  Multiple Vitamin (MULTIVITAMIN WITH MINERALS) TABS tablet Take 1 tablet by mouth daily.    [provider]    Allergies    Patient has no known allergies.  Review of Systems   Review of Systems  Constitutional: Positive for appetite change. Negative for activity change, chills, diaphoresis, fatigue and fever.  HENT: Positive for dental problem. Negative for congestion, drooling, ear discharge, ear pain, facial swelling, postnasal drip,  sore throat, tinnitus, trouble swallowing and voice change.        Soreness in mouth  Eyes: Negative.   Respiratory: Negative.   Cardiovascular: Negative.   Gastrointestinal: Negative.   Genitourinary: Negative.   Musculoskeletal: Negative.   Skin: Negative.   Allergic/Immunologic: Negative.   Neurological: Negative.   Hematological: Negative.   Psychiatric/Behavioral: Negative.     Physical Exam Updated Vital Signs BP 109/60 (BP Location: Left Arm)   Pulse 92   Temp 99.1 F (37.3 C) (Oral)   Resp 17   SpO2 94%   Physical Exam Vitals and nursing note reviewed.  Constitutional:      Appearance: She is obese.  HENT:     Head: Normocephalic and atraumatic.     Comments: Face is symmetric, without edema.     Nose: Nose normal.     Mouth/Throat:     Mouth: Mucous membranes are moist.     Dentition: Normal dentition.     Pharynx: Uvula midline. No pharyngeal swelling, oropharyngeal exudate, posterior oropharyngeal erythema or uvula swelling.     Comments: White pasty plaques on the buccal mucosa, palate, oropharynx, and tongue, erythema of the buccal mucosal without plaque.  No sublingual or submental TTP, no abscesses of the oropharynx. No concern for Ludwig's angina.  Teeth are absent.   No areas of fluctuance to suggest dental abscess. Eyes:     General: Lids are normal. Vision grossly intact.        Right eye: No discharge.        Left eye: No discharge.     Extraocular Movements: Extraocular movements intact.     Conjunctiva/sclera: Conjunctivae normal.     Pupils: Pupils are equal, round, and reactive to light.  Neck:     Trachea: Trachea and phonation normal.  Cardiovascular:     Rate and Rhythm: Normal rate and regular rhythm.     Pulses: Normal pulses.     Heart sounds: Normal heart sounds. No murmur heard.   Pulmonary:     Effort: Pulmonary effort is normal. No respiratory distress.     Breath sounds: Normal breath sounds. No wheezing or rales.  Chest:      Chest wall: No deformity, swelling, tenderness, crepitus or edema.  Abdominal:     General: Bowel sounds are normal. There is no distension.     Palpations: Abdomen is soft.     Tenderness: There is no abdominal tenderness. There is no guarding or rebound.  Musculoskeletal:  General: No deformity.     Cervical back: Neck supple. No rigidity, tenderness or crepitus. No pain with movement, spinous process tenderness or muscular tenderness.     Right lower leg: No edema.     Left lower leg: No edema.  Lymphadenopathy:     Cervical: No cervical adenopathy.  Skin:    General: Skin is warm and dry.     Capillary Refill: Capillary refill takes less than 2 seconds.  Neurological:     General: No focal deficit present.     Mental Status: She is alert and oriented to person, place, and time. Mental status is at baseline.  Psychiatric:        Mood and Affect: Mood normal. Affect is tearful.     ED Results / Procedures / Treatments   Labs (all labs ordered are listed, but only abnormal results are displayed) Labs Reviewed - No data to display  EKG None  Radiology No results found.  Procedures Procedures   Medications Ordered in ED Medications  ibuprofen (ADVIL) tablet 600 mg (600 mg Oral Given 12/10/20 1252)    ED Course  I have reviewed the triage vital signs and the nursing notes.  Pertinent labs & imaging results that were available during my care of the patient were reviewed by me and considered in my medical decision making (see chart for details).    MDM Rules/Calculators/A&P                         70 year old female presents with 3 days of mouth pain.  Patient without any teeth, no dentures.  Vital signs are reassuring.  Patient borderline febrile on intake with temperature of 99.1, vital signs otherwise normal.  Physical exam very reassuring.  Cardiopulmonary exam is normal, abdominal exam is benign.  Oral exam revealed white plaques on all surfaces of  oropharynx, concerning for oropharyngeal candidiasis. There are no findings to suggest Ludwig's angina, no concern for oropharyngeal abscess at this time.  Patient is speaking clearly and able to swallow.  No clear cause for oral thrush at this time; patient has not been on antibiotics/steroids, does not use inhalers, doesnot have any diagnosis of immunocompromising disease at this time.  Recommend close follow-up with PCP, and follow with oral specialist as scheduled for tomorrow.  Given otherwise reassuring physical exam and vital signs, no further work-up is warranted in the ED at this time.  Analgesia offered.  Exam and HPI consistent with diagnosis of oral candidiasis.  Will discharge with prescription for topical oral antifungal.  Catherine Lynn voiced understanding of her medical evaluation and treatment plan.  Each of her questions was answered to her expressed satisfaction.  Return precautions given.  Patient is stable and appropriate for discharge at this time.  This chart was dictated using voice recognition software, Dragon. Despite the best efforts of this provider to proofread and correct errors, errors may still occur which can change documentation meaning.   Final Clinical Impression(s) / ED Diagnoses Final diagnoses:  Thrush    Rx / DC Orders ED Discharge Orders         Ordered    nystatin (MYCOSTATIN) 100000 UNIT/ML suspension  4 times daily        12/10/20 [address] 12/10/20 1256    Carmin Muskrat, MD 12/10/20 [phone]

## 2020-12-10 NOTE — Discharge Instructions (Addendum)
You were evaluated in the emergency department today for your mouth pain.  Your vital signs are reassuring. You have a large amount of white coating on the walls of your mouth, throat, and on your tongue.  Consistent with a yeast infection in your mouth, called thrush.  You have been prescribed a medication called nystatin which you should swish around your mouth for as long as you can tolerate 4 times a day for the next 10 days.  Please call your primary care doctor this afternoon to get an appointment for later this week or early next week for reevaluation.  Additionally please keep your appointment for the gum specialist as you have scheduled for tomorrow.  Return to the emergency department if you develop any chest pain, difficulty breathing, shortness of breath, difficulty swallowing, or any other new severe symptoms.

## 2020-12-13 DIAGNOSIS — L290 Pruritus ani: Secondary | ICD-10-CM | POA: Diagnosis not present

## 2020-12-13 DIAGNOSIS — R319 Hematuria, unspecified: Secondary | ICD-10-CM | POA: Diagnosis not present

## 2021-01-06 ENCOUNTER — Encounter (HOSPITAL_COMMUNITY): Payer: Self-pay | Admitting: Emergency Medicine

## 2021-01-06 ENCOUNTER — Other Ambulatory Visit: Payer: Self-pay

## 2021-01-06 ENCOUNTER — Emergency Department (HOSPITAL_COMMUNITY): Payer: Medicare Other

## 2021-01-06 ENCOUNTER — Emergency Department (HOSPITAL_COMMUNITY)
Admission: EM | Admit: 2021-01-06 | Discharge: 2021-01-07 | Disposition: A | Discharge status: Home / Self Care | Payer: Medicare Other | Attending: Emergency Medicine | Admitting: Emergency Medicine

## 2021-01-06 DIAGNOSIS — W19XXXA Unspecified fall, initial encounter: Secondary | ICD-10-CM

## 2021-01-06 DIAGNOSIS — M25561 Pain in right knee: Secondary | ICD-10-CM | POA: Diagnosis not present

## 2021-01-06 DIAGNOSIS — Z043 Encounter for examination and observation following other accident: Secondary | ICD-10-CM | POA: Diagnosis not present

## 2021-01-06 DIAGNOSIS — Z87891 Personal history of nicotine dependence: Secondary | ICD-10-CM | POA: Insufficient documentation

## 2021-01-06 DIAGNOSIS — W1839XA Other fall on same level, initial encounter: Secondary | ICD-10-CM | POA: Insufficient documentation

## 2021-01-06 DIAGNOSIS — R000 Tachycardia, unspecified: Secondary | ICD-10-CM | POA: Diagnosis not present

## 2021-01-06 DIAGNOSIS — M25562 Pain in left knee: Secondary | ICD-10-CM | POA: Diagnosis present

## 2021-01-06 MED ORDER — OXYCODONE-ACETAMINOPHEN 5-325 MG PO TABS
1.0000 | ORAL_TABLET | Freq: Once | ORAL | Status: AC
  Administered 2021-01-06: 1 via ORAL
  Filled 2021-01-06: qty 1

## 2021-01-06 MED ORDER — ACETAMINOPHEN 325 MG PO TABS
650.0000 mg | ORAL_TABLET | Freq: Once | ORAL | Status: AC
  Administered 2021-01-06: 650 mg via ORAL
  Filled 2021-01-06: qty 2

## 2021-01-06 NOTE — ED Provider Notes (Incomplete)
[facility] EMERGENCY DEPARTMENT Provider Note   CSN: [id number] Arrival date & time: 01/06/21  1720     History Chief Complaint  Patient presents with  . Fall  . Leg Pain  . Knee Pain    Catherine Lynn is a 70 y.o. female with a hx of hypercholesterolemia who presents to the ED with complaints of left knee pain since yesterday. Patient states she got up to go to the bathroom when her left knee locked up and caused her to fall forward onto her knees yesterday. She denies head injury or LOC. Was able to get up and has been ambulating with her cane but continues to have significant left knee pain/stiffness as well as some mild left hip and right knee pain. Worse with movement, alleviated some with tylenol which she last took this AM. Denies numbness, tingling, weakness, neck pain, back pain, chest pain, dyspnea, or abdominal pain.   HPI     Past Medical History:  Diagnosis Date  . High cholesterol   . Obesity (BMI 30-39.9)   . Pelvic mass     There are no problems to display for this patient.   Past Surgical History:  Procedure Laterality Date  . COLONOSCOPY    . ROBOTIC ASSISTED SALPINGO OOPHERECTOMY N/A 03/12/2020   Procedure: XI ROBOTIC ASSISTED TOTAL HYSTERECTOMY WITH BILATERAL SALPINGOOPHORECTOMY;  Surgeon: [name], MD;  Location: [facility];  Service: Gynecology;  Laterality: N/A;  . TUBAL LIGATION       OB History    Gravida  2   Para  2   Term      Preterm      AB      Living  2     SAB      IAB      Ectopic      Multiple      Live Births  2           Family History  Problem Relation Age of Onset  . Breast cancer Sister   . Diabetes Sister   . Cancer Maternal Grandfather        not sure  . Cancer Niece        not sure    Social History   Tobacco Use  . Smoking status: Former Smoker    Types: Cigarettes    Quit date: 03/05/2018    Years since quitting: 2.8  . Smokeless tobacco: Never Used  . Tobacco  comment: QUit about 2 years ago  Vaping Use  . Vaping Use: Former  Substance Use Topics  . Alcohol use: Not Currently  . Drug use: Not Currently    Types: "Crack" cocaine, Marijuana    Comment: over 20 years, Marijuana once per monthly currently    Home Medications Prior to Admission medications   Medication Sig Start Date End Date Taking? Authorizing Provider  atorvastatin (LIPITOR) 20 MG tablet Take 20 mg by mouth daily.    [provider]  calcium carbonate (OSCAL) 1500 (600 Ca) MG TABS tablet Take 600 mg of elemental calcium by mouth daily with breakfast.    [provider]  Multiple Vitamin (MULTIVITAMIN WITH MINERALS) TABS tablet Take 1 tablet by mouth daily.    [provider]    Allergies    Patient has no known allergies.  Review of Systems   Review of Systems  Constitutional: Negative for chills and fever.  Eyes: Negative for visual disturbance.  Respiratory: Negative for cough and  shortness of breath.   Cardiovascular: Negative for chest pain.  Gastrointestinal: Negative for anal bleeding and vomiting.  Musculoskeletal: Positive for arthralgias. Negative for back pain and neck pain.  Skin: Negative for wound.  Neurological: Negative for syncope, weakness, numbness and headaches.  All other systems reviewed and are negative.   Physical Exam Updated Vital Signs BP (!) 148/75 (BP Location: Left Arm)   Pulse (!) 106   Temp 99 F (37.2 C) (Oral)   Resp 20   SpO2 93%   Physical Exam Vitals and nursing note reviewed.  Constitutional:      General: She is not in acute distress.    Appearance: She is well-developed and well-nourished.  HENT:     Head: Normocephalic and atraumatic. No raccoon eyes or Battle's sign.     Right Ear: No hemotympanum.     Left Ear: No hemotympanum.     Mouth/Throat:     Mouth: Oropharynx is clear and moist.  Eyes:     General:        Right eye: No discharge.        Left eye: No discharge.      Extraocular Movements: EOM normal.     Conjunctiva/sclera: Conjunctivae normal.     Pupils: Pupils are equal, round, and reactive to light.  Cardiovascular:     Rate and Rhythm: Regular rhythm. Tachycardia present.     Heart sounds: No murmur heard.     Comments: 2+ symmetric DP & PT pulses bilaterally.  Pulmonary:     Effort: No respiratory distress.     Breath sounds: Normal breath sounds. No wheezing or rales.  Chest:     Chest wall: No tenderness.  Abdominal:     General: There is no distension.     Palpations: Abdomen is soft.     Tenderness: There is no abdominal tenderness. There is no guarding or rebound.  Musculoskeletal:     Cervical back: Neck supple. No spinous process tenderness.     Comments: No obvious deformities or significant open wounds. No increased erythema/warmth. No obvious swelling.  UEs: Actively ranging at all major joints without focal areas of tenderness.  Back: No midline tenderness or palpable step off.  LEs: Intact AROM throughout the RLE. LLE able to lift off of the stretcher some actively but has pain with this, able to actively flex the left knee to about 90 degrees and fully extend, otherwise intact full ROM throughout the LLE. I am able to passively range all LE joints. She is tender to palpation to the left lateral hip over the greater trochanter, the anterior knee including the patella as well as the anterior left lower leg. Right anterior knee including the patella tender to palpation. Otherwise no significant areas of tenderness to palpation.   Skin:    General: Skin is warm and dry.     Findings: No rash.  Neurological:     Comments: Alert.  Clear speech.  Sensation grossly intact bilateral upper extremities.  5/5 symmetric strength with plantar dorsiflexion bilaterally.  Psychiatric:        Mood and Affect: Mood and affect normal.        Behavior: Behavior normal.     ED Results / Procedures / Treatments   Labs (all labs ordered are  listed, but only abnormal results are displayed) Labs Reviewed - No data to display  EKG None  Radiology DG Tibia/Fibula Left  Result Date: 01/06/2021 CLINICAL DATA:  [place], difficulty ambulating EXAM: LEFT  TIBIA AND FIBULA - 2 VIEW COMPARISON:  None. FINDINGS: Frontal and lateral views of the left tibia and fibula are obtained. No acute displaced fractures. Alignment of the left knee and ankle is anatomic. Soft tissues are normal. IMPRESSION: 1. Unremarkable left tibia and fibula. Electronically Signed   By: Randa Ngo M.D.   On: 01/06/2021 23:49   DG Knee Complete 4 Views Left  Result Date: 01/06/2021 CLINICAL DATA:  Fall last night with bilateral knee pain. EXAM: LEFT KNEE - COMPLETE 4+ VIEW COMPARISON:  None. FINDINGS: No evidence of fracture, dislocation, or joint effusion. Minimal peripheral spurring with preservation of joint spaces. Soft tissues are unremarkable. IMPRESSION: No fracture or subluxation of the left knee. Electronically Signed   By: Keith Rake M.D.   On: 01/06/2021 18:34   DG Knee Complete 4 Views Right  Result Date: 01/06/2021 CLINICAL DATA:  Fall last night with bilateral knee pain. EXAM: RIGHT KNEE - COMPLETE 4+ VIEW COMPARISON:  None. FINDINGS: No evidence of fracture, dislocation, or joint effusion. Minimal peripheral spurring and spurring of the tibial spines. No significant joint space narrowing. Soft tissues are unremarkable. IMPRESSION: No fracture or subluxation of the right knee. Electronically Signed   By: Keith Rake M.D.   On: 01/06/2021 18:35   DG Hip Unilat With Pelvis 2-3 Views Left  Result Date: 01/06/2021 CLINICAL DATA:  [place], inability to ambulate EXAM: DG HIP (WITH OR WITHOUT PELVIS) 2-3V LEFT COMPARISON:  None. FINDINGS: Frontal view of the pelvis as well as frontal and frogleg lateral views of the left hip are obtained. No acute fracture, subluxation, or dislocation. Enthesopathic changes are seen at the greater trochanters bilaterally. Mild  symmetrical bilateral hip osteoarthritis. Sacroiliac joints are normal. IMPRESSION: 1. Osteoarthritis.  No acute fracture. Electronically Signed   By: Randa Ngo M.D.   On: 01/06/2021 23:50    Procedures Procedures   Medications Ordered in ED Medications - No data to display  ED Course  I have reviewed the triage vital signs and the nursing notes.  Pertinent labs & imaging results that were available during my care of the patient were reviewed by me and considered in my medical decision making (see chart for details).    MDM Rules/Calculators/A&P                          Patient presents to the ED with complaints of primarily left knee pain with fall yesterday.  Nontoxic, mildly tachycardic and hypertensive. No signs of serious head/neck/back injury, no head trauma reported, no LOC, no neuro deficits, no midline spinal tenderness. NO chest/abdominal tenderness. Plan for LE imaging.   Additional history obtained:  Additional history obtained from chart review & nursing note review.   Imaging Studies ordered:  I ordered imaging studies which included right hip and tibia/fibula x-ray in addition to bilateral knee x-rays by triage, I independently reviewed, formal radiology impression show no fractures or dislocations.  No erythema, warmth, or effusions noted to the joints of the lower extremities, afebrile, does not seem consistent with a septic joint.  No calf tenderness or edema to raise concern for DVT.  Lower extremities are well-perfused does not seem consistent with arterial ischemia.  She was without fracture or dislocation.  Patient was placed in a knee immobilizer of the left lower extremity, she was able to weight-bear and ambulate with assistive device.  She is feeling somewhat better following analgesics.  Feel she is appropriate for discharge home  with continued pain control and close orthopedics follow-up. I discussed results, treatment plan, need for follow-up, and return  precautions with the patient. Provided opportunity for questions, patient confirmed understanding and is in agreement with plan. Findings and plan of care discussed with supervising physician Dr. Dayna Barker.   Portions of this note were generated with [employer]. Dictation errors may occur despite best attempts at proofreading.  Final Clinical Impression(s) / ED Diagnoses Final diagnoses:  Fall, initial encounter  Acute pain of left knee    Rx / DC Orders ED Discharge Orders         Ordered    diclofenac Sodium (VOLTAREN) 1 % GEL  4 times daily PRN        01/07/21 0113           Petrucelli, Glynda Jaeger, PA-C 01/07/21 0115    Amaryllis Dyke, PA-C 01/07/21 0116    Mesner, Corene Cornea, MD 01/07/21 [phone]

## 2021-01-06 NOTE — ED Triage Notes (Signed)
Pt. Stated, last night I fell, I think my knee locked up . This morning I could not hardly walk. Pain in both knees.

## 2021-01-07 DIAGNOSIS — M25562 Pain in left knee: Secondary | ICD-10-CM | POA: Diagnosis not present

## 2021-01-07 MED ORDER — DICLOFENAC SODIUM 1 % EX GEL: 4.0000 g | Freq: Four times a day (QID) | CUTANEOUS | 0 refills | Status: AC | PRN

## 2021-01-07 NOTE — Discharge Instructions (Addendum)
Please read and follow all provided instructions.  You have been seen today for pain in your lower extremities after a fall.  Tests performed today include: An x-ray of the affected areas - does NOT show any broken bones or dislocations.  Vital signs. See below for your results today.   Home care instructions: -- *PRICE in the first 24-48 hours after injury: Protect (with brace, splint, sling), if given by your provider Rest Ice- Do not apply ice pack directly to your skin, place towel or similar between your skin and ice/ice pack. Apply ice for 20 min, then remove for 40 min while awake Compression- Wear brace, elastic bandage, splint as directed by your provider Elevate affected extremity above the level of your heart when not walking around for the first 24-48 hours   Medications:  Diclofenac gel-apply 4 g of gel to your left knee 4 times per day as needed for pain.  This is an anti-inflammatory gel to help with pain and swelling.  You make take Tylenol per over the counter dosing with these medications.   We have prescribed you new medication(s) today. Discuss the medications prescribed today with your pharmacist as they can have adverse effects and interactions with your other medicines including over the counter and prescribed medications. Seek medical evaluation if you start to experience new or abnormal symptoms after taking one of these medicines, seek care immediately if you start to experience difficulty breathing, feeling of your throat closing, facial swelling, or rash as these could be indications of a more serious allergic reaction   Follow-up instructions: Please follow-up with your primary care provider or the provided orthopedic physician (bone specialist) if you continue to have significant pain in 1 week. In this case you may have a more severe injury that requires further care.   Return instructions:  Please return if your digits or extremity are numb or tingling,  appear gray or blue, or you have severe pain (also elevate the extremity and loosen splint or wrap if you were given one) Please return if you have redness or fevers.  Please return to the Emergency Department if you experience worsening symptoms.  Please return if you have any other emergent concerns. Additional Information:  Your vital signs today were: BP (!) 138/106   Pulse (!) 104   Temp 99 F (37.2 C) (Oral)   Resp (!) 25   SpO2 94%  If your blood pressure (BP) was elevated above 135/85 this visit, please have this repeated by your doctor within one month. ---------------

## 2021-01-07 NOTE — Progress Notes (Signed)
Orthopedic Tech Progress Note Patient Details:  Catherine Lynn 01-16-1951 [id number]  Ortho Devices Type of Ortho Device: Knee Immobilizer Ortho Device/Splint Location: lle Ortho Device/Splint Interventions: Ordered,Application,Adjustment   Post Interventions Patient Tolerated: Well Instructions Provided: Care of device,Adjustment of device   Karolee Stamps 01/07/2021, 1:57 AM

## 2021-01-15 DIAGNOSIS — M1712 Unilateral primary osteoarthritis, left knee: Secondary | ICD-10-CM | POA: Diagnosis not present

## 2021-02-07 IMAGING — US US PELVIS COMPLETE WITH TRANSVAGINAL
1 series · 13 of 25 positions shown · non-contrast
Comparison: CT 01/24/2020

CLINICAL DATA: Pelvic mass seen on CT 01/24/2020



[Series 1: us pelvic complete with transvaginal · 76 acquisitions, 13 frames shown]
[im 1/76]
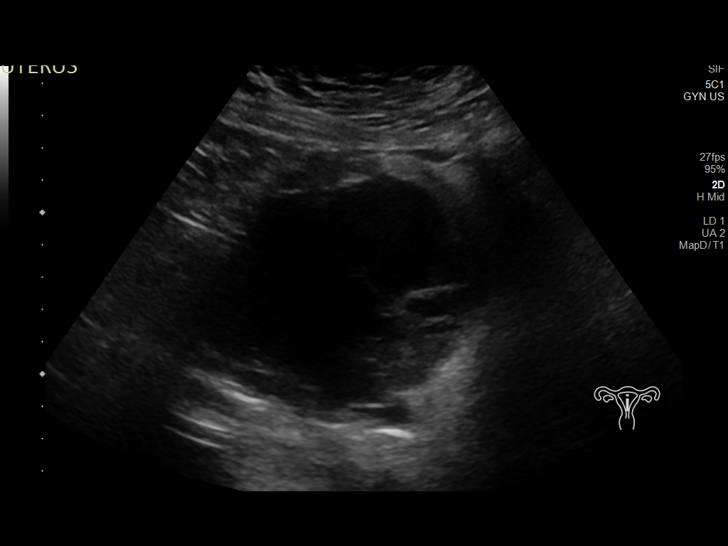
[im 7/76]
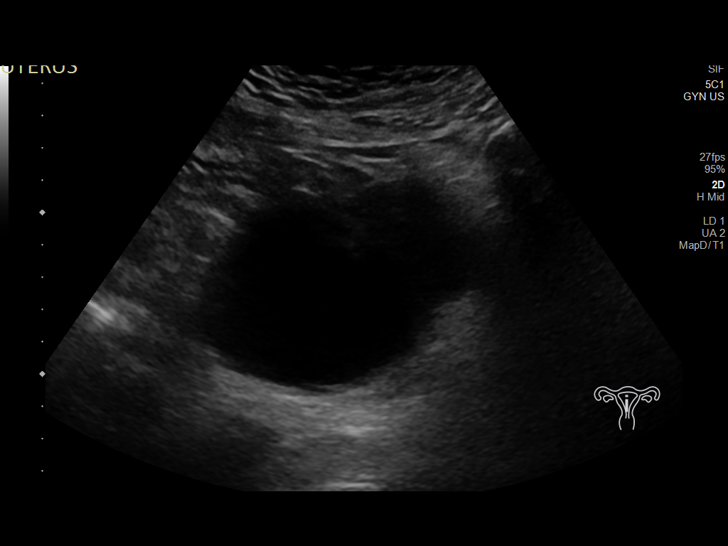
[im 13/76]
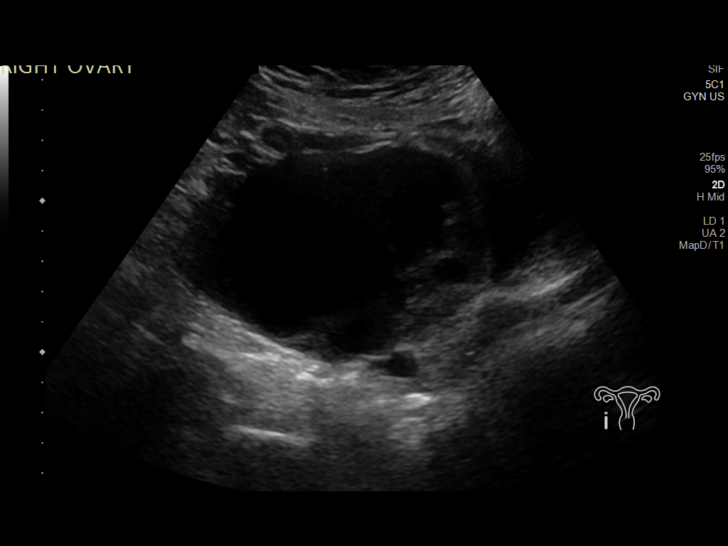
[im 19/76]
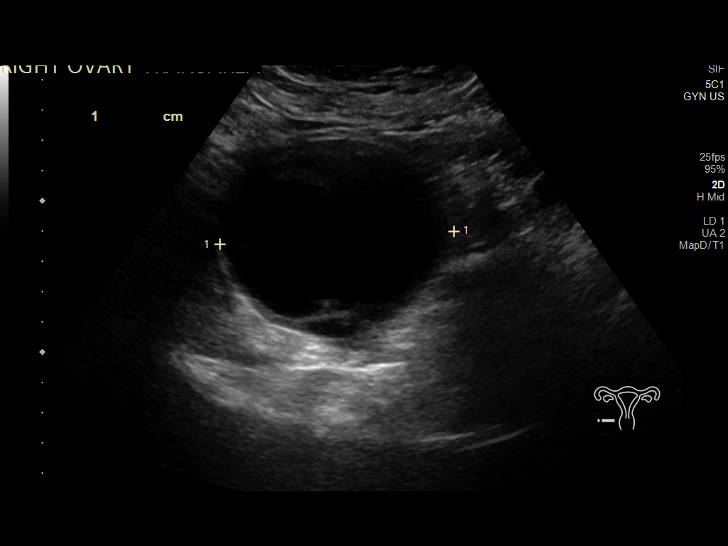
[im 26/76]
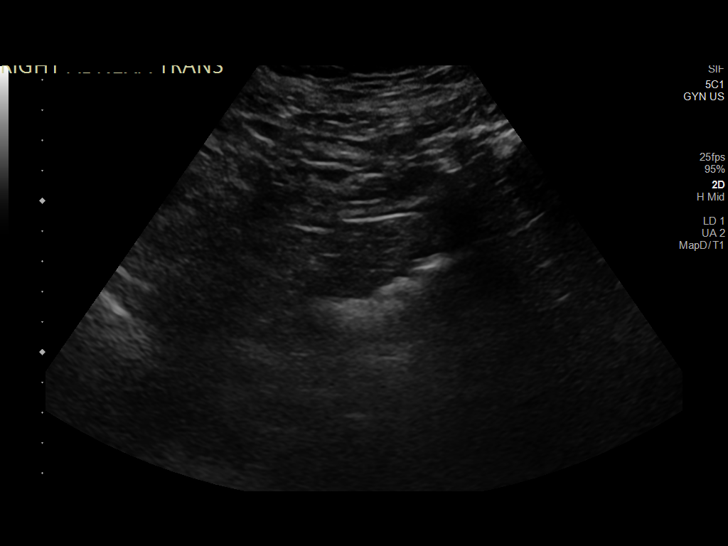
[im 32/76]
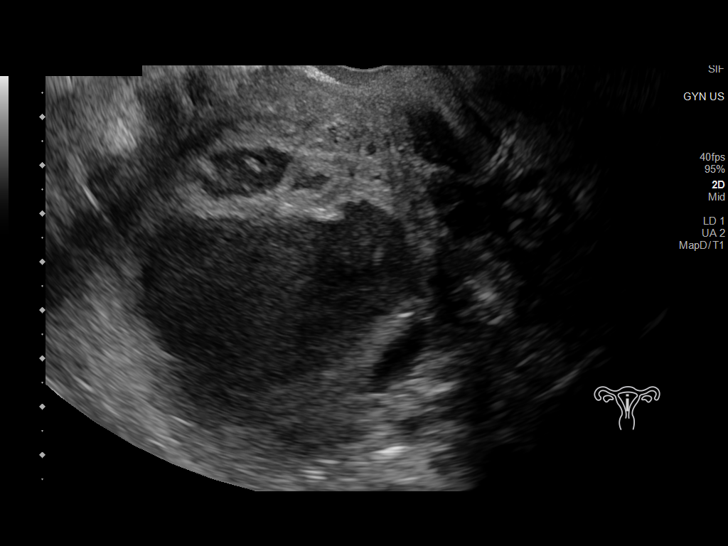
[im 38/76]
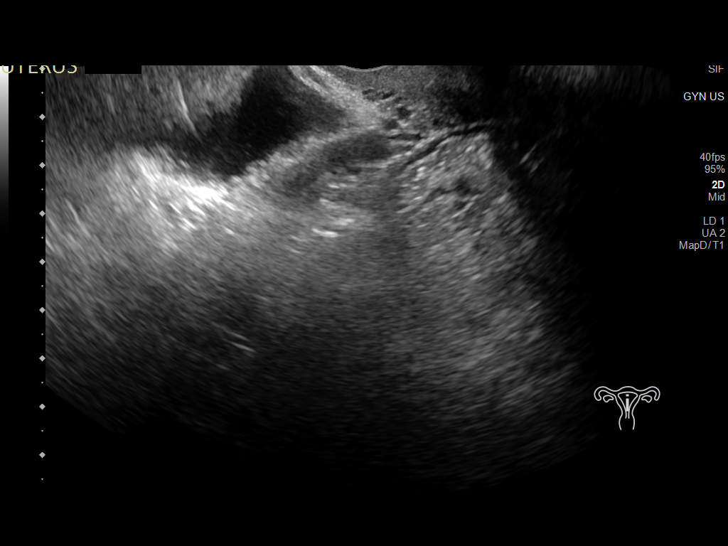
[im 44/76]
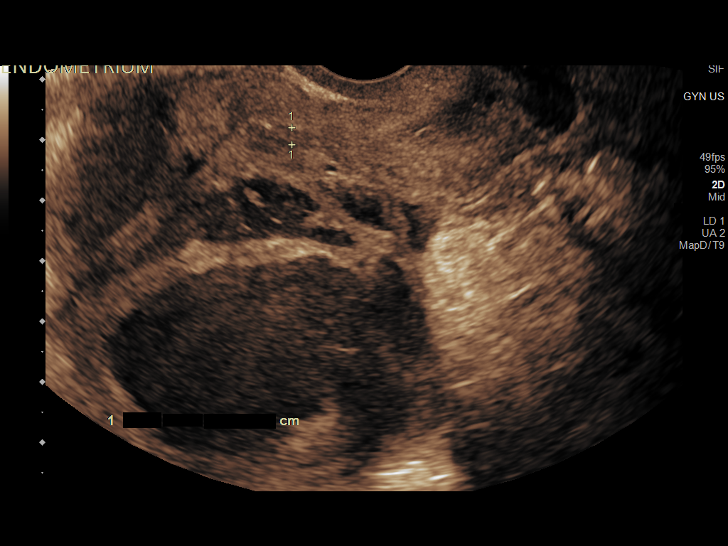
[im 51/76]
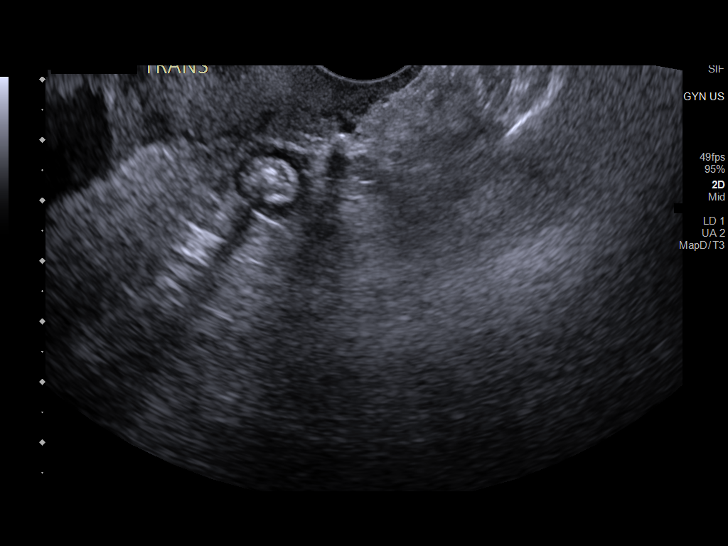
[im 57/76]
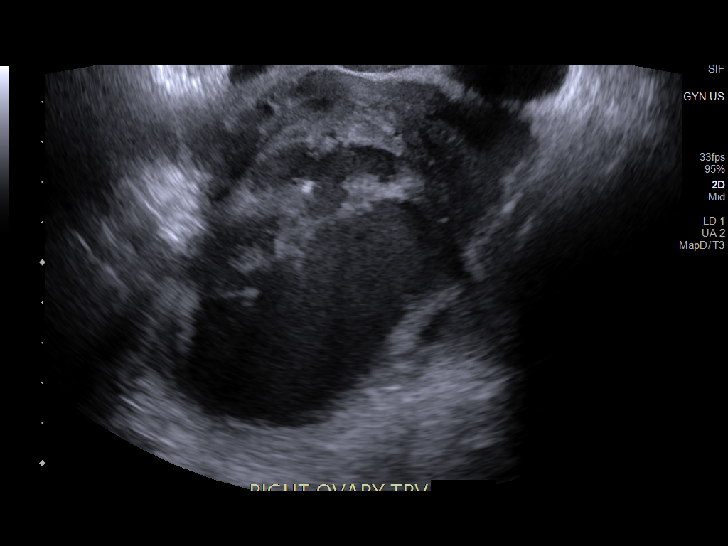
[im 63/76]
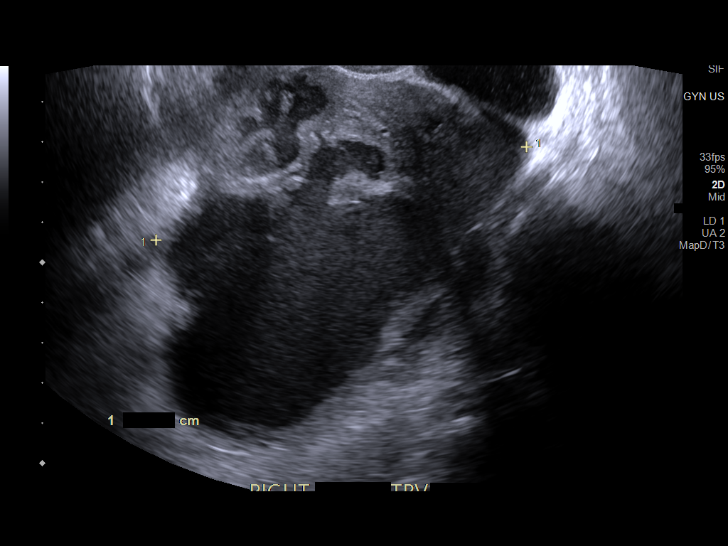
[im 69/76]
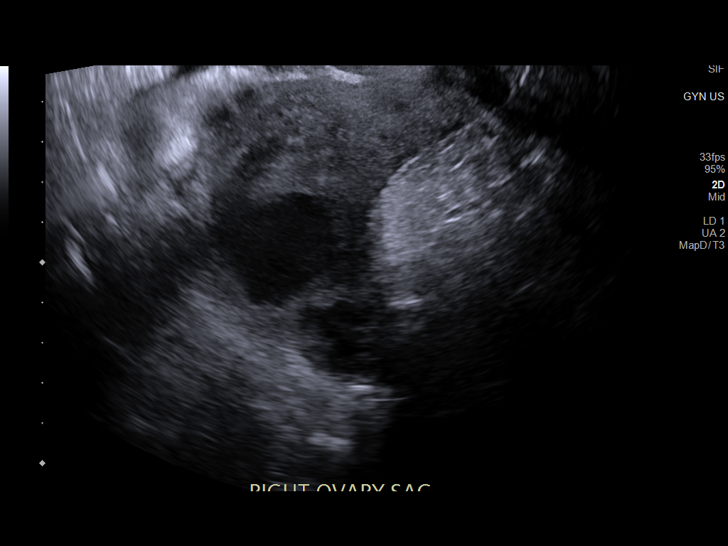
[im 76/76]
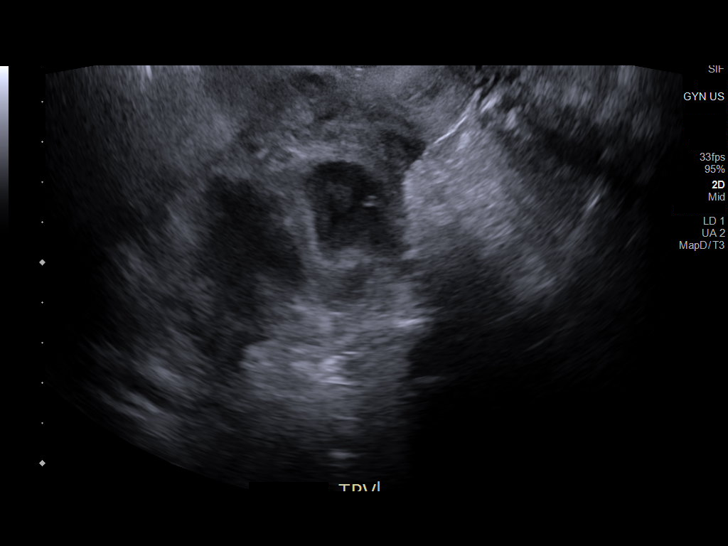

[13 of 25 positions shown; findings below may reference images not displayed]

FINDINGS: Uterus

Measurements: Either adjacent to or extending from the right side of
the uterus there is a complex heterogeneously cystic mass with thick
internal septations and nodularity which measures 8.9 x 8.9 x
cm. No internal vascularity seen within the mass. The uterus
measures 6.8 x 2.9 x 2.9 cm.

Endometrium

Thickness: 3 mm.  No focal abnormality visualized.

Right ovary

Measurements: Not visualized and cystic complex mass seen extending
within the right adnexa.

Left ovary

Measurements: Not visualized

Other findings

Trace free fluid within the cul-de-sac.
IMPRESSION: Complex cystic mass either extending from the superior right uterus
or adjacent to the right uterus within the right adnexa measuring
8.9 x 8.9 x 9.5 cm. This could represent pyosalpinx, TOA, or uterine
neoplasm.

Nonvisualized ovaries.

## 2021-05-22 DIAGNOSIS — Z803 Family history of malignant neoplasm of breast: Secondary | ICD-10-CM | POA: Diagnosis not present

## 2021-05-22 DIAGNOSIS — Z1231 Encounter for screening mammogram for malignant neoplasm of breast: Secondary | ICD-10-CM | POA: Diagnosis not present

## 2021-05-30 DIAGNOSIS — K5790 Diverticulosis of intestine, part unspecified, without perforation or abscess without bleeding: Secondary | ICD-10-CM | POA: Diagnosis not present

## 2021-05-30 DIAGNOSIS — Z0000 Encounter for general adult medical examination without abnormal findings: Secondary | ICD-10-CM | POA: Diagnosis not present

## 2021-05-30 DIAGNOSIS — M8580 Other specified disorders of bone density and structure, unspecified site: Secondary | ICD-10-CM | POA: Diagnosis not present

## 2021-06-04 DIAGNOSIS — D508 Other iron deficiency anemias: Secondary | ICD-10-CM | POA: Diagnosis not present

## 2021-06-04 DIAGNOSIS — E7800 Pure hypercholesterolemia, unspecified: Secondary | ICD-10-CM | POA: Diagnosis not present

## 2021-07-11 DIAGNOSIS — K921 Melena: Secondary | ICD-10-CM | POA: Diagnosis not present

## 2021-07-11 DIAGNOSIS — Z23 Encounter for immunization: Secondary | ICD-10-CM | POA: Diagnosis not present

## 2021-10-02 DIAGNOSIS — R7309 Other abnormal glucose: Secondary | ICD-10-CM | POA: Diagnosis not present

## 2021-12-01 DIAGNOSIS — R030 Elevated blood-pressure reading, without diagnosis of hypertension: Secondary | ICD-10-CM | POA: Diagnosis not present

## 2021-12-01 DIAGNOSIS — E7800 Pure hypercholesterolemia, unspecified: Secondary | ICD-10-CM | POA: Diagnosis not present

## 2021-12-01 DIAGNOSIS — R7303 Prediabetes: Secondary | ICD-10-CM | POA: Diagnosis not present

## 2022-05-12 DIAGNOSIS — H905 Unspecified sensorineural hearing loss: Secondary | ICD-10-CM | POA: Diagnosis not present

## 2022-05-28 DIAGNOSIS — Z1231 Encounter for screening mammogram for malignant neoplasm of breast: Secondary | ICD-10-CM | POA: Diagnosis not present

## 2022-06-01 DIAGNOSIS — K5790 Diverticulosis of intestine, part unspecified, without perforation or abscess without bleeding: Secondary | ICD-10-CM | POA: Diagnosis not present

## 2022-06-01 DIAGNOSIS — L304 Erythema intertrigo: Secondary | ICD-10-CM | POA: Diagnosis not present

## 2022-06-01 DIAGNOSIS — I10 Essential (primary) hypertension: Secondary | ICD-10-CM | POA: Diagnosis not present

## 2022-06-01 DIAGNOSIS — Z86010 Personal history of colonic polyps: Secondary | ICD-10-CM | POA: Diagnosis not present

## 2022-06-01 DIAGNOSIS — H918X3 Other specified hearing loss, bilateral: Secondary | ICD-10-CM | POA: Diagnosis not present

## 2022-06-01 DIAGNOSIS — E7800 Pure hypercholesterolemia, unspecified: Secondary | ICD-10-CM | POA: Diagnosis not present

## 2022-06-01 DIAGNOSIS — R7303 Prediabetes: Secondary | ICD-10-CM | POA: Diagnosis not present

## 2022-06-01 DIAGNOSIS — Z0000 Encounter for general adult medical examination without abnormal findings: Secondary | ICD-10-CM | POA: Diagnosis not present

## 2022-12-03 DIAGNOSIS — E7800 Pure hypercholesterolemia, unspecified: Secondary | ICD-10-CM | POA: Diagnosis not present

## 2022-12-03 DIAGNOSIS — R011 Cardiac murmur, unspecified: Secondary | ICD-10-CM | POA: Diagnosis not present

## 2022-12-03 DIAGNOSIS — I700 Atherosclerosis of aorta: Secondary | ICD-10-CM | POA: Diagnosis not present

## 2022-12-03 DIAGNOSIS — I10 Essential (primary) hypertension: Secondary | ICD-10-CM | POA: Diagnosis not present

## 2022-12-03 DIAGNOSIS — M8580 Other specified disorders of bone density and structure, unspecified site: Secondary | ICD-10-CM | POA: Diagnosis not present

## 2023-06-03 DIAGNOSIS — Z1231 Encounter for screening mammogram for malignant neoplasm of breast: Secondary | ICD-10-CM | POA: Diagnosis not present

## 2023-06-03 DIAGNOSIS — M8588 Other specified disorders of bone density and structure, other site: Secondary | ICD-10-CM | POA: Diagnosis not present

## 2023-06-04 DIAGNOSIS — Z0000 Encounter for general adult medical examination without abnormal findings: Secondary | ICD-10-CM | POA: Diagnosis not present

## 2023-06-04 DIAGNOSIS — L304 Erythema intertrigo: Secondary | ICD-10-CM | POA: Diagnosis not present

## 2023-06-04 DIAGNOSIS — K5790 Diverticulosis of intestine, part unspecified, without perforation or abscess without bleeding: Secondary | ICD-10-CM | POA: Diagnosis not present

## 2023-06-04 DIAGNOSIS — E7800 Pure hypercholesterolemia, unspecified: Secondary | ICD-10-CM | POA: Diagnosis not present

## 2023-06-04 DIAGNOSIS — I10 Essential (primary) hypertension: Secondary | ICD-10-CM | POA: Diagnosis not present

## 2023-06-04 DIAGNOSIS — I700 Atherosclerosis of aorta: Secondary | ICD-10-CM | POA: Diagnosis not present

## 2023-06-04 DIAGNOSIS — R7303 Prediabetes: Secondary | ICD-10-CM | POA: Diagnosis not present

## 2023-06-04 DIAGNOSIS — M8580 Other specified disorders of bone density and structure, unspecified site: Secondary | ICD-10-CM | POA: Diagnosis not present

## 2023-06-04 DIAGNOSIS — D508 Other iron deficiency anemias: Secondary | ICD-10-CM | POA: Diagnosis not present

## 2023-07-17 DIAGNOSIS — Z23 Encounter for immunization: Secondary | ICD-10-CM | POA: Diagnosis not present

## 2023-09-06 ENCOUNTER — Encounter (HOSPITAL_COMMUNITY): Payer: Self-pay | Admitting: Emergency Medicine

## 2023-12-08 DIAGNOSIS — E7800 Pure hypercholesterolemia, unspecified: Secondary | ICD-10-CM | POA: Diagnosis not present

## 2023-12-08 DIAGNOSIS — I10 Essential (primary) hypertension: Secondary | ICD-10-CM | POA: Diagnosis not present

## 2023-12-08 DIAGNOSIS — R7303 Prediabetes: Secondary | ICD-10-CM | POA: Diagnosis not present

## 2023-12-08 DIAGNOSIS — D508 Other iron deficiency anemias: Secondary | ICD-10-CM | POA: Diagnosis not present

## 2023-12-08 DIAGNOSIS — M8580 Other specified disorders of bone density and structure, unspecified site: Secondary | ICD-10-CM | POA: Diagnosis not present

## 2023-12-08 DIAGNOSIS — R011 Cardiac murmur, unspecified: Secondary | ICD-10-CM | POA: Diagnosis not present

## 2024-01-10 DIAGNOSIS — L918 Other hypertrophic disorders of the skin: Secondary | ICD-10-CM | POA: Diagnosis not present

## 2024-06-06 DIAGNOSIS — I10 Essential (primary) hypertension: Secondary | ICD-10-CM | POA: Diagnosis not present

## 2024-06-06 DIAGNOSIS — K5790 Diverticulosis of intestine, part unspecified, without perforation or abscess without bleeding: Secondary | ICD-10-CM | POA: Diagnosis not present

## 2024-06-06 DIAGNOSIS — Z0000 Encounter for general adult medical examination without abnormal findings: Secondary | ICD-10-CM | POA: Diagnosis not present

## 2024-06-06 DIAGNOSIS — R7303 Prediabetes: Secondary | ICD-10-CM | POA: Diagnosis not present

## 2024-06-06 DIAGNOSIS — M8580 Other specified disorders of bone density and structure, unspecified site: Secondary | ICD-10-CM | POA: Diagnosis not present

## 2024-06-06 DIAGNOSIS — E7800 Pure hypercholesterolemia, unspecified: Secondary | ICD-10-CM | POA: Diagnosis not present

## 2024-06-06 DIAGNOSIS — D508 Other iron deficiency anemias: Secondary | ICD-10-CM | POA: Diagnosis not present

## 2024-06-06 DIAGNOSIS — I700 Atherosclerosis of aorta: Secondary | ICD-10-CM | POA: Diagnosis not present

## 2024-06-06 DIAGNOSIS — R011 Cardiac murmur, unspecified: Secondary | ICD-10-CM | POA: Diagnosis not present

## 2024-06-08 DIAGNOSIS — Z1231 Encounter for screening mammogram for malignant neoplasm of breast: Secondary | ICD-10-CM | POA: Diagnosis not present
